# Patient Record
Sex: Female | Born: 1970 | Race: White | Hispanic: No | Smoking: Never smoker
Health system: Southern US, Community
[De-identification: ages and names within clinical notes are randomized; demographics above are authoritative.]

## PROBLEM LIST (undated history)

## (undated) DIAGNOSIS — R319 Hematuria, unspecified: Secondary | ICD-10-CM

## (undated) DIAGNOSIS — E785 Hyperlipidemia, unspecified: Secondary | ICD-10-CM

## (undated) DIAGNOSIS — E119 Type 2 diabetes mellitus without complications: Secondary | ICD-10-CM

## (undated) DIAGNOSIS — I2510 Atherosclerotic heart disease of native coronary artery without angina pectoris: Secondary | ICD-10-CM

## (undated) DIAGNOSIS — I10 Essential (primary) hypertension: Secondary | ICD-10-CM

## (undated) HISTORY — DX: Essential (primary) hypertension: I10

## (undated) HISTORY — PX: OTHER SURGICAL HISTORY: SHX169

## (undated) HISTORY — DX: Hematuria, unspecified: R31.9

## (undated) HISTORY — PX: CHOLECYSTECTOMY: SHX55

## (undated) HISTORY — DX: Hyperlipidemia, unspecified: E78.5

## (undated) HISTORY — DX: Type 2 diabetes mellitus without complications: E11.9

## (undated) HISTORY — DX: Atherosclerotic heart disease of native coronary artery without angina pectoris: I25.10

---

## 2000-10-20 ENCOUNTER — Emergency Department (HOSPITAL_COMMUNITY): Admission: EM | Admit: 2000-10-20 | Discharge: 2000-10-21 | Payer: Self-pay | Admitting: Emergency Medicine

## 2001-01-15 ENCOUNTER — Other Ambulatory Visit: Admission: RE | Admit: 2001-01-15 | Discharge: 2001-01-15 | Payer: Self-pay | Admitting: General Surgery

## 2015-02-21 ENCOUNTER — Encounter: Payer: Self-pay | Admitting: *Deleted

## 2015-02-24 ENCOUNTER — Encounter: Payer: Self-pay | Admitting: *Deleted

## 2015-02-27 ENCOUNTER — Encounter: Payer: Self-pay | Admitting: Cardiovascular Disease

## 2015-02-27 ENCOUNTER — Encounter: Payer: Self-pay | Admitting: *Deleted

## 2015-02-27 ENCOUNTER — Ambulatory Visit (INDEPENDENT_AMBULATORY_CARE_PROVIDER_SITE_OTHER): Payer: BLUE CROSS/BLUE SHIELD | Admitting: Cardiovascular Disease

## 2015-02-27 VITALS — BP 130/82 | HR 86 | Ht 59.0 in | Wt 170.0 lb

## 2015-02-27 DIAGNOSIS — E785 Hyperlipidemia, unspecified: Secondary | ICD-10-CM

## 2015-02-27 DIAGNOSIS — Z8249 Family history of ischemic heart disease and other diseases of the circulatory system: Secondary | ICD-10-CM

## 2015-02-27 DIAGNOSIS — R079 Chest pain, unspecified: Secondary | ICD-10-CM | POA: Diagnosis not present

## 2015-02-27 NOTE — Patient Instructions (Signed)
Your physician has requested that you have an exercise tolerance test. For further information please visit [email or web address] Please also follow instruction sheet, as given. Office will contact with results via phone or letter.   Continue all current medications. Follow up in  6 weeks

## 2015-02-27 NOTE — Progress Notes (Signed)
Patient ID: Tracie Cochran, female   DOB: 15-Apr-1970, 45 y.o.   MRN: [id number]       CARDIOLOGY CONSULT NOTE  Patient ID: Tracie Cochran MRN: [id number] DOB/AGE: 03-Jul-1970 45 y.o.  Admit date: (Not on file) Primary Physician Lenise Herald, PA-C  Reason for Consultation: chest pain  HPI: The patient is a 45 year old woman with a history of GERD and hyperlipidemia who is referred for the evaluation of chest pain. ECG performed at outside facility on 01/17/15 did not demonstrate any ischemic ST segment or T-wave abnormalities.  She had been experiencing chest pressure which awoke her from sleep but after she was started on omeprazole, her symptoms subsided. She has been having chest pain at rest on the left lateral side of her breast as well as underneath her left breast. She does not have any significant exertional chest pain or dyspnea. She has had some left-sided neck pain.   Review of labs performed on 01/18/15 showed hemoglobin 12.6, platelets 379, glucose 87, BUN 13, creatinine 0.58, total cholesterol 223, triglycerides 136, HDL 75, LDL 121.  Fam: Mother had diabetes, CAD in her 61's, died at 36. Father died at age 64, had CAD.   No Known Allergies  Current Outpatient Prescriptions  Medication Sig Dispense Refill  . omeprazole (PRILOSEC) 40 MG capsule Take 1 capsule by mouth daily.  0  . pravastatin (PRAVACHOL) 20 MG tablet Take 1 tablet by mouth daily.  1   No current facility-administered medications for this visit.    Past Medical History  Diagnosis Date  . Coronary artery disease   . Hypertension   . Diabetes mellitus without complication (HCC)   . Hyperlipidemia     No past surgical history on file.  Social History   Social History  . Marital Status: Legally Separated    Spouse Name: N/A  . Number of Children: N/A  . Years of Education: N/A   Occupational History  . Not on file.   Social History Main Topics  . Smoking status: Never Smoker   .  Smokeless tobacco: Not on file  . Alcohol Use: Not on file  . Drug Use: Not on file  . Sexual Activity: Not on file   Other Topics Concern  . Not on file   Social History Narrative  . No narrative on file       Prior to Admission medications   Medication Sig Start Date End Date Taking? Authorizing Provider  omeprazole (PRILOSEC) 40 MG capsule Take 1 capsule by mouth daily. 02/21/15  Yes Historical Provider, MD  pravastatin (PRAVACHOL) 20 MG tablet Take 1 tablet by mouth daily. 02/21/15  Yes Historical Provider, MD     Review of systems complete and found to be negative unless listed above in HPI     Physical exam Blood pressure 130/82, pulse 86, height  (1.499 m), weight 170 lb (77.111 kg). General: NAD Neck: No JVD, no thyromegaly or thyroid nodule.  Lungs: Clear to auscultation bilaterally with normal respiratory effort. CV: Nondisplaced PMI. Regular rate and rhythm, normal S1/S2, no S3/S4, no murmur.  No peripheral edema.  No carotid bruit.  Normal pedal pulses.  Abdomen: Soft, nontender, no hepatosplenomegaly, no distention.  Skin: Intact without lesions or rashes.  Neurologic: Alert and oriented x 3.  Psych: Normal affect. Extremities: No clubbing or cyanosis.  HEENT: Normal.   ECG: Most recent ECG reviewed.  Labs:  No results found for: WBC, HGB, HCT, MCV, PLT No results for input(s): NA,  K, CL, CO2, BUN, CREATININE, CALCIUM, PROT, BILITOT, ALKPHOS, ALT, AST, GLUCOSE in the last 168 hours.  Invalid input(s): LABALBU No results found for: CKTOTAL, CKMB, CKMBINDEX, TROPONINI No results found for: CHOL No results found for: HDL No results found for: LDLCALC No results found for: TRIG No results found for: CHOLHDL No results found for: LDLDIRECT       Studies: No results found.  ASSESSMENT AND PLAN:  1. Chest pain: Somewhat atypical features for CAD. Family h/o premature CAD in mother. Had hyperlipidemia which is treated. Will obtain a GXT.  2.  Hyperlipidemia: Mildly elevated TC noted above. Continue pravastatin.  Dispo: f/u 6 weeks.   Signed: Prentice Docker, M.D., F.A.C.C.  02/27/2015, 2:41 PM

## 2015-03-10 ENCOUNTER — Ambulatory Visit (HOSPITAL_COMMUNITY): Admission: RE | Admit: 2015-03-10 | Payer: BLUE CROSS/BLUE SHIELD | Source: Ambulatory Visit

## 2015-04-11 ENCOUNTER — Telehealth: Payer: Self-pay | Admitting: *Deleted

## 2015-04-11 NOTE — Telephone Encounter (Signed)
Last seen 02/27/15 - Dr. Kirtland BouchardK requested patient have GXT.  Was scheduled for 03/10/2015, but patient did not show.  Patient has 6 week follow up scheduled for 04/17/2015.    Left message to return call.

## 2015-04-17 ENCOUNTER — Ambulatory Visit: Payer: BLUE CROSS/BLUE SHIELD | Admitting: Cardiovascular Disease

## 2015-04-21 NOTE — Telephone Encounter (Signed)
Appointment for 04/17/2015 was cancelled by patient.  Reason given - (PATIENT CALLED SAID TO CANCEL APPT. DID NOT WANT TO RESCHEDULE - NO REASON GIVEN )

## 2015-11-28 ENCOUNTER — Encounter (HOSPITAL_COMMUNITY): Payer: Self-pay | Admitting: Emergency Medicine

## 2015-11-28 ENCOUNTER — Emergency Department (HOSPITAL_COMMUNITY)
Admission: EM | Admit: 2015-11-28 | Discharge: 2015-11-28 | Disposition: A | Discharge status: Home / Self Care | Payer: BLUE CROSS/BLUE SHIELD | Attending: Emergency Medicine | Admitting: Emergency Medicine

## 2015-11-28 DIAGNOSIS — Z79899 Other long term (current) drug therapy: Secondary | ICD-10-CM | POA: Insufficient documentation

## 2015-11-28 DIAGNOSIS — E119 Type 2 diabetes mellitus without complications: Secondary | ICD-10-CM | POA: Diagnosis not present

## 2015-11-28 DIAGNOSIS — I2510 Atherosclerotic heart disease of native coronary artery without angina pectoris: Secondary | ICD-10-CM | POA: Diagnosis not present

## 2015-11-28 DIAGNOSIS — I10 Essential (primary) hypertension: Secondary | ICD-10-CM | POA: Insufficient documentation

## 2015-11-28 DIAGNOSIS — D649 Anemia, unspecified: Secondary | ICD-10-CM | POA: Diagnosis not present

## 2015-11-28 LAB — URINALYSIS, ROUTINE W REFLEX MICROSCOPIC
Bilirubin Urine: NEGATIVE
GLUCOSE, UA: NEGATIVE mg/dL
Ketones, ur: NEGATIVE mg/dL
Leukocytes, UA: NEGATIVE
Nitrite: NEGATIVE
Protein, ur: NEGATIVE mg/dL
SPECIFIC GRAVITY, URINE: 1.01 (ref 1.005–1.030)
pH: 5.5 (ref 5.0–8.0)

## 2015-11-28 LAB — BASIC METABOLIC PANEL
Anion gap: 6 (ref 5–15)
BUN: 16 mg/dL (ref 6–20)
CO2: 25 mmol/L (ref 22–32)
Calcium: 9.6 mg/dL (ref 8.9–10.3)
Chloride: 105 mmol/L (ref 101–111)
Creatinine, Ser: 0.66 mg/dL (ref 0.44–1.00)
GFR calc Af Amer: >60 mL/min (ref >60)
GLUCOSE: 117 mg/dL — AB (ref 65–99)
POTASSIUM: 3.2 mmol/L — AB (ref 3.5–5.1)
Sodium: 136 mmol/L (ref 135–145)

## 2015-11-28 LAB — PREGNANCY, URINE: Preg Test, Ur: NEGATIVE

## 2015-11-28 LAB — CBC WITH DIFFERENTIAL/PLATELET
BASOS PCT: 1 %
Basophils Absolute: 0.1 10*3/uL (ref 0.0–0.1)
EOS ABS: 0.7 10*3/uL (ref 0.0–0.7)
Eosinophils Relative: 7 %
HCT: 32.5 % — ABNORMAL LOW (ref 36.0–46.0)
Hemoglobin: 9.5 g/dL — ABNORMAL LOW (ref 12.0–15.0)
LYMPHS ABS: 2.3 10*3/uL (ref 0.7–4.0)
LYMPHS PCT: 24 %
MCH: 19.6 pg — AB (ref 26.0–34.0)
MCHC: 29.2 g/dL — ABNORMAL LOW (ref 30.0–36.0)
MCV: 67.1 fL — AB (ref 78.0–100.0)
MONO ABS: 0.7 10*3/uL (ref 0.1–1.0)
Monocytes Relative: 7 %
NEUTROS ABS: 5.7 10*3/uL (ref 1.7–7.7)
Neutrophils Relative %: 61 %
PLATELETS: 492 10*3/uL — AB (ref 150–400)
RBC: 4.84 MIL/uL (ref 3.87–5.11)
RDW: 17.9 % — ABNORMAL HIGH (ref 11.5–15.5)
WBC: 9.5 10*3/uL (ref 4.0–10.5)

## 2015-11-28 LAB — URINE MICROSCOPIC-ADD ON

## 2015-11-28 MED ORDER — POTASSIUM CHLORIDE CRYS ER 20 MEQ PO TBCR
40.0000 meq | EXTENDED_RELEASE_TABLET | Freq: Once | ORAL | Status: AC
  Administered 2015-11-28: 40 meq via ORAL
  Filled 2015-11-28: qty 2

## 2015-11-28 NOTE — ED Notes (Signed)
Pt states she was sent by occupational health nurse at her job for HGB of 9.0 drawn x 1 week ago. Pt denies any complaints.

## 2015-11-28 NOTE — ED Provider Notes (Signed)
[facility] DEPT Provider Note   CSN: [id number] Arrival date & time: 11/28/15  1341     History   Chief Complaint Chief Complaint  Patient presents with  . Low Hemoglobin    HPI Tracie Cochran is a 45 y.o. female.  HPI  Pt was seen at 1440. Per pt: Pt sent to the ED for evaluation of "abnormal labs" drawn 1 week ago. Pt states the occupational health nurse at her job called her PMD who told her to come to the ED for evaluation for a "Hgb 9." Pt denies any symptoms. Pt endorses "heavier menses" with last menses 11/14/15. Denies black or blood in stools. Denies CP/palpitations, no SOB, no back pain, no abd pain, no pelvic pain, no N/V/D, no fevers, no rash.    Past Medical History:  Diagnosis Date  . Coronary artery disease   . Diabetes mellitus without complication (HCC)   . Hyperlipidemia   . Hypertension     There are no active problems to display for this patient.   Past Surgical History:  Procedure Laterality Date  . cesaerian      OB History    Gravida Para Term Preterm AB Living   1             SAB TAB Ectopic Multiple Live Births                   Home Medications    Prior to Admission medications   Medication Sig Start Date End Date Taking? Authorizing Provider  omeprazole (PRILOSEC) 40 MG capsule Take 1 capsule by mouth daily. 02/21/15   Historical Provider, MD  pravastatin (PRAVACHOL) 20 MG tablet Take 1 tablet by mouth daily. 02/21/15   Historical Provider, MD    Family History Family History  Problem Relation Age of Onset  . CAD      Social History Social History  Substance Use Topics  . Smoking status: Never Smoker  . Smokeless tobacco: Never Used  . Alcohol use No     Allergies   Review of patient's allergies indicates no known allergies.   Review of Systems Review of Systems ROS: Statement: All systems negative except as marked or noted in the HPI; Constitutional: Negative for fever and chills. ; ; Eyes: Negative for eye  pain, redness and discharge. ; ; ENMT: Negative for ear pain, hoarseness, nasal congestion, sinus pressure and sore throat. ; ; Cardiovascular: Negative for chest pain, palpitations, diaphoresis, dyspnea and peripheral edema. ; ; Respiratory: Negative for cough, wheezing and stridor. ; ; Gastrointestinal: Negative for nausea, vomiting, diarrhea, abdominal pain, blood in stool, hematemesis, jaundice and rectal bleeding. . ; ; Genitourinary: Negative for dysuria, flank pain and hematuria. ; ; Musculoskeletal: Negative for back pain and neck pain. Negative for swelling and trauma.; ; Skin: Negative for pruritus, rash, abrasions, blisters, bruising and skin lesion.; ; Neuro: Negative for headache, lightheadedness and neck stiffness. Negative for weakness, altered level of consciousness, altered mental status, extremity weakness, paresthesias, involuntary movement, seizure and syncope.       Physical Exam Updated Vital Signs BP 151/89 (BP Location: Left Arm)   Pulse 84   Temp 97.7 F (36.5 C) (Oral)   Resp 20   Ht 5' (1.524 m)   Wt 164 lb (74.4 kg)   LMP 11/14/2015   SpO2 100%   BMI 32.03 kg/m   14:52:02 Orthostatic Vital Signs TV  Orthostatic Lying   BP- Lying: 121/72  Pulse- Lying: 78  Orthostatic Sitting  BP- Sitting: 124/79  Pulse- Sitting: 82      Orthostatic Standing at 0 minutes  BP- Standing at 0 minutes: 124/87  Pulse- Standing at 0 minutes: 88     Physical Exam 1445: Physical examination:  Nursing notes reviewed; Vital signs and O2 SAT reviewed;  Constitutional: Well developed, Well nourished, Well hydrated, In no acute distress; Head:  Normocephalic, atraumatic; Eyes: EOMI, PERRL, No scleral icterus; ENMT: Mouth and pharynx normal, Mucous membranes moist; Neck: Supple, Full range of motion, No lymphadenopathy; Cardiovascular: Regular rate and rhythm, No gallop; Respiratory: Breath sounds clear & equal bilaterally, No wheezes.  Speaking full sentences with ease, Normal  respiratory effort/excursion; Chest: Nontender, Movement normal; Abdomen: Soft, Nontender, Nondistended, Normal bowel sounds. Rectal exam performed w/permission of pt and ED RN chaperone present.  Anal tone normal.  Non-tender, soft brown stool in rectal vault, heme neg.  No fissures, no external hemorrhoids, no palp masses.; Genitourinary: No CVA tenderness; Extremities: Pulses normal, No tenderness, No edema, No calf edema or asymmetry.; Neuro: AA&Ox3, Major CN grossly intact.  Speech clear. No gross focal motor or sensory deficits in extremities.; Skin: Color normal, Warm, Dry.   ED Treatments / Results  Labs (all labs ordered are listed, but only abnormal results are displayed)   EKG  EKG Interpretation None       Radiology   Procedures Procedures (including critical care time)  Medications Ordered in ED Medications - No data to display   Initial Impression / Assessment and Plan / ED Course  I have reviewed the triage vital signs and the nursing notes.  Pertinent labs & imaging results that were available during my care of the patient were reviewed by me and considered in my medical decision making (see chart for details).  MDM Reviewed: previous chart, nursing note and vitals Reviewed previous: labs Interpretation: labs   Results for orders placed or performed during the hospital encounter of 11/28/15  CBC with Differential  Result Value Ref Range   WBC 9.5 4.0 - 10.5 K/uL   RBC 4.84 3.87 - 5.11 MIL/uL   Hemoglobin 9.5 (L) 12.0 - 15.0 g/dL   HCT 40.9 (L) 81.1 - 91.4 %   MCV 67.1 (L) 78.0 - 100.0 fL   MCH 19.6 (L) 26.0 - 34.0 pg   MCHC 29.2 (L) 30.0 - 36.0 g/dL   RDW 78.2 (H) 95.6 - 21.3 %   Platelets 492 (H) 150 - 400 K/uL   Neutrophils Relative % 61 %   Lymphocytes Relative 24 %   Monocytes Relative 7 %   Eosinophils Relative 7 %   Basophils Relative 1 %   Neutro Abs 5.7 1.7 - 7.7 K/uL   Lymphs Abs 2.3 0.7 - 4.0 K/uL   Monocytes Absolute 0.7 0.1 - 1.0 K/uL    Eosinophils Absolute 0.7 0.0 - 0.7 K/uL   Basophils Absolute 0.1 0.0 - 0.1 K/uL   RBC Morphology ELLIPTOCYTES    Smear Review PLATELET COUNT CONFIRMED BY SMEAR   Basic metabolic panel  Result Value Ref Range   Sodium 136 135 - 145 mmol/L   Potassium 3.2 (L) 3.5 - 5.1 mmol/L   Chloride 105 101 - 111 mmol/L   CO2 25 22 - 32 mmol/L   Glucose, Bld 117 (H) 65 - 99 mg/dL   BUN 16 6 - 20 mg/dL   Creatinine, Ser 0.86 0.44 - 1.00 mg/dL   Calcium 9.6 8.9 - 57.8 mg/dL   GFR calc non Af Amer >60 >  60 mL/min   GFR calc Af Amer >60 >60 mL/min   Anion gap 6 5 - 15  Urinalysis, Routine w reflex microscopic  Result Value Ref Range   Color, Urine YELLOW YELLOW   APPearance CLEAR CLEAR   Specific Gravity, Urine 1.010 1.005 - 1.030   pH 5.5 5.0 - 8.0   Glucose, UA NEGATIVE NEGATIVE mg/dL   Hgb urine dipstick SMALL (A) NEGATIVE   Bilirubin Urine NEGATIVE NEGATIVE   Ketones, ur NEGATIVE NEGATIVE mg/dL   Protein, ur NEGATIVE NEGATIVE mg/dL   Nitrite NEGATIVE NEGATIVE   Leukocytes, UA NEGATIVE NEGATIVE  Pregnancy, urine  Result Value Ref Range   Preg Test, Ur NEGATIVE NEGATIVE  Urine microscopic-add on  Result Value Ref Range   Squamous Epithelial / LPF 0-5 (A) NONE SEEN   WBC, UA 0-5 0 - 5 WBC/hpf   RBC / HPF 0-5 0 - 5 RBC/hpf   Bacteria, UA RARE (A) NONE SEEN    1615:  Pt states her Hgb was "9" two weeks ago. Stable today. VSS, not orthostatic. Stool heme negative. Pt denies any complaints. Pt would like to go home now. No clear indication for admission/transfusion at this time. Will d/c home with outpatient f/u. Dx and testing d/w pt.  Questions answered.  Verb understanding, agreeable to d/c home with outpt f/u.    Final Clinical Impressions(s) / ED Diagnoses   Final diagnoses:  None    New Prescriptions New Prescriptions   No medications on file     Samuel JesterKathleen Tajae Rybicki, DO 11/29/15 2033

## 2015-11-28 NOTE — ED Triage Notes (Signed)
Patient was called by [place] office to come into ED due to Hemoglobin of 9.0. Patient denies black tarry stools. Patient states feeling fatigued.

## 2015-11-28 NOTE — Discharge Instructions (Signed)
Take your usual prescriptions as previously directed.  Call your regular medical doctor and your OB/GYN doctor tomorrow to schedule a follow up appointment within the next week.  Return to the Emergency Department immediately sooner if worsening.

## 2015-11-29 LAB — POC OCCULT BLOOD, ED: Fecal Occult Bld: NEGATIVE

## 2016-05-06 ENCOUNTER — Encounter: Payer: Self-pay | Admitting: *Deleted

## 2017-06-17 DIAGNOSIS — R7301 Impaired fasting glucose: Secondary | ICD-10-CM | POA: Diagnosis not present

## 2017-06-17 DIAGNOSIS — Z719 Counseling, unspecified: Secondary | ICD-10-CM | POA: Diagnosis not present

## 2017-06-17 DIAGNOSIS — E559 Vitamin D deficiency, unspecified: Secondary | ICD-10-CM | POA: Diagnosis not present

## 2017-06-17 DIAGNOSIS — Z008 Encounter for other general examination: Secondary | ICD-10-CM | POA: Diagnosis not present

## 2017-07-08 DIAGNOSIS — E559 Vitamin D deficiency, unspecified: Secondary | ICD-10-CM | POA: Diagnosis not present

## 2017-07-08 DIAGNOSIS — E785 Hyperlipidemia, unspecified: Secondary | ICD-10-CM | POA: Diagnosis not present

## 2017-07-08 DIAGNOSIS — D649 Anemia, unspecified: Secondary | ICD-10-CM | POA: Diagnosis not present

## 2017-07-08 DIAGNOSIS — Z79899 Other long term (current) drug therapy: Secondary | ICD-10-CM | POA: Diagnosis not present

## 2017-07-24 DIAGNOSIS — R7301 Impaired fasting glucose: Secondary | ICD-10-CM | POA: Diagnosis not present

## 2017-07-24 DIAGNOSIS — E785 Hyperlipidemia, unspecified: Secondary | ICD-10-CM | POA: Diagnosis not present

## 2017-07-24 DIAGNOSIS — D649 Anemia, unspecified: Secondary | ICD-10-CM | POA: Diagnosis not present

## 2017-07-24 DIAGNOSIS — E559 Vitamin D deficiency, unspecified: Secondary | ICD-10-CM | POA: Diagnosis not present

## 2017-11-25 DIAGNOSIS — Z719 Counseling, unspecified: Secondary | ICD-10-CM | POA: Diagnosis not present

## 2017-11-25 DIAGNOSIS — E559 Vitamin D deficiency, unspecified: Secondary | ICD-10-CM | POA: Diagnosis not present

## 2017-11-25 DIAGNOSIS — Z008 Encounter for other general examination: Secondary | ICD-10-CM | POA: Diagnosis not present

## 2017-11-25 DIAGNOSIS — R7301 Impaired fasting glucose: Secondary | ICD-10-CM | POA: Diagnosis not present

## 2018-01-23 DIAGNOSIS — S82891D Other fracture of right lower leg, subsequent encounter for closed fracture with routine healing: Secondary | ICD-10-CM | POA: Diagnosis not present

## 2018-01-23 DIAGNOSIS — S8291XA Unspecified fracture of right lower leg, initial encounter for closed fracture: Secondary | ICD-10-CM | POA: Diagnosis not present

## 2018-02-06 DIAGNOSIS — S82891D Other fracture of right lower leg, subsequent encounter for closed fracture with routine healing: Secondary | ICD-10-CM | POA: Diagnosis not present

## 2018-02-10 DIAGNOSIS — Z0000 Encounter for general adult medical examination without abnormal findings: Secondary | ICD-10-CM | POA: Diagnosis not present

## 2018-02-10 DIAGNOSIS — Z79899 Other long term (current) drug therapy: Secondary | ICD-10-CM | POA: Diagnosis not present

## 2018-02-10 DIAGNOSIS — R7301 Impaired fasting glucose: Secondary | ICD-10-CM | POA: Diagnosis not present

## 2018-02-10 DIAGNOSIS — E559 Vitamin D deficiency, unspecified: Secondary | ICD-10-CM | POA: Diagnosis not present

## 2018-02-10 DIAGNOSIS — E785 Hyperlipidemia, unspecified: Secondary | ICD-10-CM | POA: Diagnosis not present

## 2018-02-10 DIAGNOSIS — D649 Anemia, unspecified: Secondary | ICD-10-CM | POA: Diagnosis not present

## 2018-02-24 DIAGNOSIS — R7301 Impaired fasting glucose: Secondary | ICD-10-CM | POA: Diagnosis not present

## 2018-02-24 DIAGNOSIS — E559 Vitamin D deficiency, unspecified: Secondary | ICD-10-CM | POA: Diagnosis not present

## 2018-02-24 DIAGNOSIS — D649 Anemia, unspecified: Secondary | ICD-10-CM | POA: Diagnosis not present

## 2018-02-24 DIAGNOSIS — E785 Hyperlipidemia, unspecified: Secondary | ICD-10-CM | POA: Diagnosis not present

## 2018-08-18 DIAGNOSIS — R7301 Impaired fasting glucose: Secondary | ICD-10-CM | POA: Diagnosis not present

## 2018-08-18 DIAGNOSIS — E785 Hyperlipidemia, unspecified: Secondary | ICD-10-CM | POA: Diagnosis not present

## 2018-08-18 DIAGNOSIS — Z79899 Other long term (current) drug therapy: Secondary | ICD-10-CM | POA: Diagnosis not present

## 2018-08-18 DIAGNOSIS — E559 Vitamin D deficiency, unspecified: Secondary | ICD-10-CM | POA: Diagnosis not present

## 2018-08-18 DIAGNOSIS — Z0130 Encounter for examination of blood pressure without abnormal findings: Secondary | ICD-10-CM | POA: Diagnosis not present

## 2018-10-12 DIAGNOSIS — Z1389 Encounter for screening for other disorder: Secondary | ICD-10-CM | POA: Diagnosis not present

## 2018-10-12 DIAGNOSIS — M5417 Radiculopathy, lumbosacral region: Secondary | ICD-10-CM | POA: Diagnosis not present

## 2018-10-12 DIAGNOSIS — Z6835 Body mass index (BMI) 35.0-35.9, adult: Secondary | ICD-10-CM | POA: Diagnosis not present

## 2018-10-12 DIAGNOSIS — R3129 Other microscopic hematuria: Secondary | ICD-10-CM | POA: Diagnosis not present

## 2018-10-12 DIAGNOSIS — M5410 Radiculopathy, site unspecified: Secondary | ICD-10-CM | POA: Diagnosis not present

## 2018-10-12 DIAGNOSIS — K219 Gastro-esophageal reflux disease without esophagitis: Secondary | ICD-10-CM | POA: Diagnosis not present

## 2018-11-03 DIAGNOSIS — N390 Urinary tract infection, site not specified: Secondary | ICD-10-CM | POA: Diagnosis not present

## 2018-11-06 DIAGNOSIS — Z124 Encounter for screening for malignant neoplasm of cervix: Secondary | ICD-10-CM | POA: Diagnosis not present

## 2018-11-06 DIAGNOSIS — R238 Other skin changes: Secondary | ICD-10-CM | POA: Diagnosis not present

## 2018-11-06 DIAGNOSIS — E6609 Other obesity due to excess calories: Secondary | ICD-10-CM | POA: Diagnosis not present

## 2018-11-06 DIAGNOSIS — Z01411 Encounter for gynecological examination (general) (routine) with abnormal findings: Secondary | ICD-10-CM | POA: Diagnosis not present

## 2018-11-06 DIAGNOSIS — Z6834 Body mass index (BMI) 34.0-34.9, adult: Secondary | ICD-10-CM | POA: Diagnosis not present

## 2018-11-10 DIAGNOSIS — E669 Obesity, unspecified: Secondary | ICD-10-CM | POA: Diagnosis not present

## 2018-11-10 DIAGNOSIS — Z713 Dietary counseling and surveillance: Secondary | ICD-10-CM | POA: Diagnosis not present

## 2018-11-10 DIAGNOSIS — Z6835 Body mass index (BMI) 35.0-35.9, adult: Secondary | ICD-10-CM | POA: Diagnosis not present

## 2018-11-11 DIAGNOSIS — E6609 Other obesity due to excess calories: Secondary | ICD-10-CM | POA: Diagnosis not present

## 2018-11-11 DIAGNOSIS — Z6834 Body mass index (BMI) 34.0-34.9, adult: Secondary | ICD-10-CM | POA: Diagnosis not present

## 2018-12-11 ENCOUNTER — Other Ambulatory Visit: Payer: Self-pay

## 2018-12-11 ENCOUNTER — Other Ambulatory Visit (HOSPITAL_COMMUNITY)
Admission: RE | Admit: 2018-12-11 | Discharge: 2018-12-11 | Disposition: A | Discharge status: Home / Self Care | Payer: BC Managed Care – PPO | Source: Ambulatory Visit | Attending: Urology | Admitting: Urology

## 2018-12-11 ENCOUNTER — Ambulatory Visit (INDEPENDENT_AMBULATORY_CARE_PROVIDER_SITE_OTHER): Payer: BC Managed Care – PPO | Admitting: Urology

## 2018-12-11 DIAGNOSIS — R3121 Asymptomatic microscopic hematuria: Secondary | ICD-10-CM

## 2018-12-11 LAB — URINALYSIS, COMPLETE (UACMP) WITH MICROSCOPIC
Bilirubin Urine: NEGATIVE
Glucose, UA: NEGATIVE mg/dL
Ketones, ur: NEGATIVE mg/dL
Leukocytes,Ua: NEGATIVE
Nitrite: NEGATIVE
Protein, ur: NEGATIVE mg/dL
Specific Gravity, Urine: 1.024 (ref 1.005–1.030)
pH: 5 (ref 5.0–8.0)

## 2018-12-15 ENCOUNTER — Other Ambulatory Visit: Payer: Self-pay | Admitting: Urology

## 2018-12-15 ENCOUNTER — Other Ambulatory Visit (HOSPITAL_COMMUNITY): Payer: Self-pay | Admitting: Urology

## 2018-12-15 DIAGNOSIS — R3121 Asymptomatic microscopic hematuria: Secondary | ICD-10-CM

## 2018-12-28 ENCOUNTER — Other Ambulatory Visit: Payer: Self-pay

## 2018-12-28 ENCOUNTER — Ambulatory Visit (HOSPITAL_COMMUNITY)
Admission: RE | Admit: 2018-12-28 | Discharge: 2018-12-28 | Disposition: A | Discharge status: Home / Self Care | Payer: BC Managed Care – PPO | Source: Ambulatory Visit | Attending: Urology | Admitting: Urology

## 2018-12-28 ENCOUNTER — Encounter (HOSPITAL_COMMUNITY): Payer: Self-pay

## 2018-12-28 DIAGNOSIS — R3121 Asymptomatic microscopic hematuria: Secondary | ICD-10-CM | POA: Diagnosis not present

## 2018-12-28 DIAGNOSIS — R3129 Other microscopic hematuria: Secondary | ICD-10-CM | POA: Diagnosis not present

## 2018-12-28 MED ORDER — IOHEXOL 300 MG/ML  SOLN
150.0000 mL | Freq: Once | INTRAMUSCULAR | Status: AC | PRN
  Administered 2018-12-28: 125 mL via INTRAVENOUS

## 2019-02-04 ENCOUNTER — Other Ambulatory Visit: Payer: Self-pay

## 2019-02-04 ENCOUNTER — Ambulatory Visit: Payer: BC Managed Care – PPO | Attending: Internal Medicine

## 2019-02-04 DIAGNOSIS — Z20822 Contact with and (suspected) exposure to covid-19: Secondary | ICD-10-CM | POA: Insufficient documentation

## 2019-02-05 ENCOUNTER — Other Ambulatory Visit: Payer: BC Managed Care – PPO | Admitting: Urology

## 2019-02-06 LAB — NOVEL CORONAVIRUS, NAA: SARS-CoV-2, NAA: NOT DETECTED

## 2019-03-18 NOTE — Progress Notes (Deleted)
Subjective:  No diagnosis found.   Tracie Cochran is a 49 yo female who is sent in consultation by Terie Purser PA for microhematuria with 1+ blood on a recent dip UA. She had a round of antibiotics but the hematuria persisted. She has had no gross hematuria. Her UA has 3+ heme today. She has some nocturia x 1 and she can have some frequency. She has some intermittency. She has no urgency but she can have some SUI. She has had mid back pain but no flank pain. She has no history of stones, UTI's or GU surgery. She has no family history of bladder cancer.   CT IMPRESSION: 1.  No acute process or explanation for hematuria. 2.  Aortic Atherosclerosis (ICD10-I70.0).    ROS:  ROS:  A complete review of systems was performed.  All systems are negative except for pertinent findings as noted.   ROS  No Known Allergies  Outpatient Encounter Medications as of 03/19/2019  Medication Sig Note  . ibuprofen (ADVIL,MOTRIN) 200 MG tablet Take 800 mg by mouth every 6 (six) hours as needed for moderate pain.   Marland Kitchen omeprazole (PRILOSEC) 40 MG capsule Take 1 capsule by mouth daily.   . rosuvastatin (CRESTOR) 5 MG tablet Take 5 mg by mouth daily. 11/28/2015: New prescription. Has not started yet.   No facility-administered encounter medications on file as of 03/19/2019.    Past Medical History:  Diagnosis Date  . Coronary artery disease   . Diabetes mellitus without complication (HCC)   . Hyperlipidemia   . Hypertension     Past Surgical History:  Procedure Laterality Date  . cesaerian      Social History   Socioeconomic History  . Marital status: Divorced    Spouse name: Not on file  . Number of children: Not on file  . Years of education: Not on file  . Highest education level: Not on file  Occupational History  . Not on file  Tobacco Use  . Smoking status: Never Smoker  . Smokeless tobacco: Never Used  Substance and Sexual Activity  . Alcohol use: No    Alcohol/week: 0.0 standard  drinks  . Drug use: No  . Sexual activity: Not on file  Other Topics Concern  . Not on file  Social History Narrative  . Not on file   Social Determinants of Health   Financial Resource Strain:   . Difficulty of Paying Living Expenses: Not on file  Food Insecurity:   . Worried About Programme researcher, broadcasting/film/video in the Last Year: Not on file  . Ran Out of Food in the Last Year: Not on file  Transportation Needs:   . Lack of Transportation (Medical): Not on file  . Lack of Transportation (Non-Medical): Not on file  Physical Activity:   . Days of Exercise per Week: Not on file  . Minutes of Exercise per Session: Not on file  Stress:   . Feeling of Stress : Not on file  Social Connections:   . Frequency of Communication with Friends and Family: Not on file  . Frequency of Social Gatherings with Friends and Family: Not on file  . Attends Religious Services: Not on file  . Active Member of Clubs or Organizations: Not on file  . Attends Banker Meetings: Not on file  . Marital Status: Not on file  Intimate Partner Violence:   . Fear of Current or Ex-Partner: Not on file  . Emotionally Abused: Not on file  .  Physically Abused: Not on file  . Sexually Abused: Not on file    Family History  Problem Relation Age of Onset  . CAD Other        Objective: There were no vitals filed for this visit.   Physical Exam  Lab Results:  No results found for this or any previous visit (from the past 24 hour(s)).  BMET No results for input(s): NA, K, CL, CO2, GLUCOSE, BUN, CREATININE, CALCIUM in the last 72 hours. PSA No results found for: PSA No results found for: TESTOSTERONE    Studies/Results: No results found.    Assessment & Plan: No problem-specific Assessment & Plan notes found for this encounter.    No orders of the defined types were placed in this encounter.    No orders of the defined types were placed in this encounter.     No follow-ups on  file.   CC: Pllc, [place] 03/18/2019

## 2019-03-19 ENCOUNTER — Other Ambulatory Visit: Payer: Self-pay | Admitting: Urology

## 2019-04-07 ENCOUNTER — Ambulatory Visit: Payer: Self-pay | Admitting: Orthopaedic Surgery

## 2019-05-21 ENCOUNTER — Encounter: Payer: Self-pay | Admitting: Urology

## 2019-05-21 ENCOUNTER — Ambulatory Visit (INDEPENDENT_AMBULATORY_CARE_PROVIDER_SITE_OTHER): Payer: BC Managed Care – PPO | Admitting: Urology

## 2019-05-21 ENCOUNTER — Other Ambulatory Visit: Payer: Self-pay

## 2019-05-21 VITALS — BP 139/86 | HR 104 | Temp 98.1°F

## 2019-05-21 DIAGNOSIS — R3129 Other microscopic hematuria: Secondary | ICD-10-CM

## 2019-05-21 LAB — POCT URINALYSIS DIPSTICK
Bilirubin, UA: NEGATIVE
Glucose, UA: NEGATIVE
Ketones, UA: NEGATIVE
Leukocytes, UA: NEGATIVE
Nitrite, UA: NEGATIVE
Protein, UA: NEGATIVE
Spec Grav, UA: >=1.03 — AB (ref 1.010–1.025)
Urobilinogen, UA: NEGATIVE E.U./dL — AB
pH, UA: 5 (ref 5.0–8.0)

## 2019-05-21 NOTE — Progress Notes (Signed)
Subjective:  1. Microhematuria      Tracie Cochran is a 49 yo female who I saw  in consultation by Delman Cheadle PA for microhematuria with 1+ blood  on 12/28/18.  Her UA in the office had 6-10 RBC's and a CT showed no GU findings.  She was to return for cystoscopy and is schedulded for that today.   She has some nocturia x 1 and she can have some frequency. She has some intermittency. She has no urgency but she can have some SUI. She has had mid back pain but no flank pain. She has no history of stones, UTI's or GU surgery. She has no family history of bladder cancer.   UA today has 3+ blood.       ROS:  ROS:  A complete review of systems was performed.  All systems are negative except for pertinent findings as noted.   ROS  No Known Allergies  Outpatient Encounter Medications as of 05/21/2019  Medication Sig Note  . ibuprofen (ADVIL,MOTRIN) 200 MG tablet Take 800 mg by mouth every 6 (six) hours as needed for moderate pain.   Marland Kitchen omeprazole (PRILOSEC) 40 MG capsule Take 1 capsule by mouth daily.   . rosuvastatin (CRESTOR) 5 MG tablet Take 5 mg by mouth daily. 11/28/2015: New prescription. Has not started yet.   No facility-administered encounter medications on file as of 05/21/2019.    Past Medical History:  Diagnosis Date  . Coronary artery disease   . Diabetes mellitus without complication ([place])   . Hematuria   . Hyperlipidemia     Past Surgical History:  Procedure Laterality Date  . cesaerian    . CHOLECYSTECTOMY      Social History   Socioeconomic History  . Marital status: Divorced    Spouse name: Not on file  . Number of children: Not on file  . Years of education: Not on file  . Highest education level: Not on file  Occupational History  . Not on file  Tobacco Use  . Smoking status: Never Smoker  . Smokeless tobacco: Never Used  Substance and Sexual Activity  . Alcohol use: No    Alcohol/week: 0.0 standard drinks  . Drug use: No  . Sexual activity: Not on  file  Other Topics Concern  . Not on file  Social History Narrative  . Not on file   Social Determinants of Health   Financial Resource Strain:   . Difficulty of Paying Living Expenses:   Food Insecurity:   . Worried About Charity fundraiser in the Last Year:   . Arboriculturist in the Last Year:   Transportation Needs:   . Film/video editor (Medical):   Marland Kitchen Lack of Transportation (Non-Medical):   Physical Activity:   . Days of Exercise per Week:   . Minutes of Exercise per Session:   Stress:   . Feeling of Stress :   Social Connections:   . Frequency of Communication with Friends and Family:   . Frequency of Social Gatherings with Friends and Family:   . Attends Religious Services:   . Active Member of Clubs or Organizations:   . Attends Archivist Meetings:   Marland Kitchen Marital Status:   Intimate Partner Violence:   . Fear of Current or Ex-Partner:   . Emotionally Abused:   Marland Kitchen Physically Abused:   . Sexually Abused:     Family History  Problem Relation Age of Onset  . CAD Other   .  Diabetes Father   . Diabetes Mother        Objective: Vitals:   05/21/19 1517  BP: 139/86  Pulse: (!) 104  Temp: 98.1 F (36.7 C)     Physical Exam     Studies/Results: CLINICAL DATA:  Asymptomatic microscopic hematuria for 2 months. Cholecystectomy.  EXAM: CT ABDOMEN AND PELVIS WITHOUT AND WITH CONTRAST  TECHNIQUE: Multidetector CT imaging of the abdomen and pelvis was performed following the standard protocol before and following the bolus administration of intravenous contrast.  CONTRAST:  OMNIPAQUE IOHEXOL 300 MG/ML  SOLN  COMPARISON:  None.  FINDINGS: Lower chest: Clear lung bases. Normal heart size without pericardial or pleural effusion.  Hepatobiliary: Normal liver. Cholecystectomy, without biliary ductal dilatation.  Pancreas: Normal, without mass or ductal dilatation.  Spleen: Capsular based splenic lesion of 2.0 cm is most  consistent with the nonenhancing minimally complex cyst.  Adrenals/Urinary Tract: Normal adrenal glands. No renal calculi or hydronephrosis. No hydroureter or ureteric calculi. No bladder calculi.  Interpolar left renal too small to characterize lesion. No suspicious renal mass. Good renal collecting system opacification on delayed images. Good ureteric opacification, without filling defect  No enhancing bladder mass or filling defect on delayed images.  Stomach/Bowel: Normal stomach, without wall thickening. Normal colon and terminal ileum. Normal small bowel.  Vascular/Lymphatic: Normal caliber of the aorta and branch vessels. No abdominopelvic adenopathy.  Reproductive: Retroverted uterus. Residual left ovarian follicle or cyst at 1.9 cm.  Other: No significant free fluid.  Musculoskeletal: No acute osseous abnormality.  IMPRESSION: 1.  No acute process or explanation for hematuria. 2.  Aortic Atherosclerosis (ICD10-I70.0).   Electronically Signed   By: Jeronimo Greaves M.D.   On: 12/28/2018 13:12  Procedure: Cystoscopy.  She was prepped with betadine.  The flexible scope was inserted without difficulty.  The urethra was normal.  The bladder wall had mild trabeculation with no mucosal lesions.  The UO's were unremarkable.   There were no complications.     Assessment & Plan: Microhematuria.   Her CT and cystoscopy are negative.   She will return in 6 months.   No orders of the defined types were placed in this encounter.    Orders Placed This Encounter  Procedures  . POCT urinalysis dipstick      Return in about 6 months (around 11/20/2019) for with UA.   CC: Pllc, [facility]      Bjorn Pippin 05/21/2019

## 2019-06-23 DIAGNOSIS — L7211 Pilar cyst: Secondary | ICD-10-CM | POA: Diagnosis not present

## 2019-11-12 ENCOUNTER — Ambulatory Visit: Payer: BC Managed Care – PPO | Admitting: Urology

## 2019-11-19 ENCOUNTER — Ambulatory Visit: Payer: BC Managed Care – PPO | Admitting: Urology

## 2020-01-01 DIAGNOSIS — M1712 Unilateral primary osteoarthritis, left knee: Secondary | ICD-10-CM | POA: Diagnosis not present

## 2020-02-07 DIAGNOSIS — M79672 Pain in left foot: Secondary | ICD-10-CM | POA: Diagnosis not present

## 2020-02-07 DIAGNOSIS — M722 Plantar fascial fibromatosis: Secondary | ICD-10-CM | POA: Diagnosis not present

## 2020-02-07 DIAGNOSIS — M7662 Achilles tendinitis, left leg: Secondary | ICD-10-CM | POA: Diagnosis not present

## 2020-02-17 ENCOUNTER — Other Ambulatory Visit: Payer: BC Managed Care – PPO

## 2020-02-17 ENCOUNTER — Other Ambulatory Visit: Payer: Self-pay

## 2020-02-17 DIAGNOSIS — Z20822 Contact with and (suspected) exposure to covid-19: Secondary | ICD-10-CM | POA: Diagnosis not present

## 2020-02-19 LAB — NOVEL CORONAVIRUS, NAA: SARS-CoV-2, NAA: DETECTED — AB

## 2020-02-19 LAB — SARS-COV-2, NAA 2 DAY TAT

## 2020-02-24 DIAGNOSIS — U071 COVID-19: Secondary | ICD-10-CM | POA: Diagnosis not present

## 2020-03-17 DIAGNOSIS — R42 Dizziness and giddiness: Secondary | ICD-10-CM | POA: Diagnosis not present

## 2020-03-17 DIAGNOSIS — Z1331 Encounter for screening for depression: Secondary | ICD-10-CM | POA: Diagnosis not present

## 2020-03-17 DIAGNOSIS — E6609 Other obesity due to excess calories: Secondary | ICD-10-CM | POA: Diagnosis not present

## 2020-03-17 DIAGNOSIS — D509 Iron deficiency anemia, unspecified: Secondary | ICD-10-CM | POA: Diagnosis not present

## 2020-03-17 DIAGNOSIS — Z6835 Body mass index (BMI) 35.0-35.9, adult: Secondary | ICD-10-CM | POA: Diagnosis not present

## 2020-03-20 DIAGNOSIS — Z6835 Body mass index (BMI) 35.0-35.9, adult: Secondary | ICD-10-CM | POA: Diagnosis not present

## 2020-03-20 DIAGNOSIS — D509 Iron deficiency anemia, unspecified: Secondary | ICD-10-CM | POA: Diagnosis not present

## 2020-03-20 DIAGNOSIS — E539 Vitamin B deficiency, unspecified: Secondary | ICD-10-CM | POA: Diagnosis not present

## 2020-03-20 DIAGNOSIS — R42 Dizziness and giddiness: Secondary | ICD-10-CM | POA: Diagnosis not present

## 2020-05-22 DIAGNOSIS — M1712 Unilateral primary osteoarthritis, left knee: Secondary | ICD-10-CM | POA: Diagnosis not present

## 2020-07-17 DIAGNOSIS — M7662 Achilles tendinitis, left leg: Secondary | ICD-10-CM | POA: Diagnosis not present

## 2020-07-17 DIAGNOSIS — M722 Plantar fascial fibromatosis: Secondary | ICD-10-CM | POA: Diagnosis not present

## 2020-07-18 DIAGNOSIS — L918 Other hypertrophic disorders of the skin: Secondary | ICD-10-CM | POA: Diagnosis not present

## 2020-07-18 DIAGNOSIS — L820 Inflamed seborrheic keratosis: Secondary | ICD-10-CM | POA: Diagnosis not present

## 2020-08-11 IMAGING — CT CT ABD-PEL WO/W CM
3 of 12 series · 12 of 46 positions shown, 18 images · IV contrast (omnipaque)
Comparison: None.

CLINICAL DATA: Asymptomatic microscopic hematuria for 2 months.
Cholecystectomy.

EXAM:
CT ABDOMEN AND PELVIS WITHOUT AND WITH CONTRAST
TECHNIQUE: Multidetector CT imaging of the abdomen and pelvis was performed
following the standard protocol before and following the bolus
administration of intravenous contrast.
CONTRAST:  125mL OMNIPAQUE IOHEXOL 300 MG/ML  SOLN

[Series 2: axial pre · axial · non-contrast · 0.67mm/px · z∈[+981,+1266]mm · 5 of 87 slices shown, 10 images]
[im 15/87  soft-tissue]
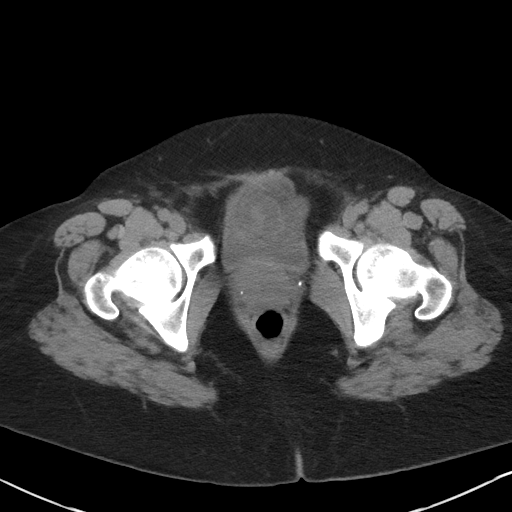
[im 15/87  bone]
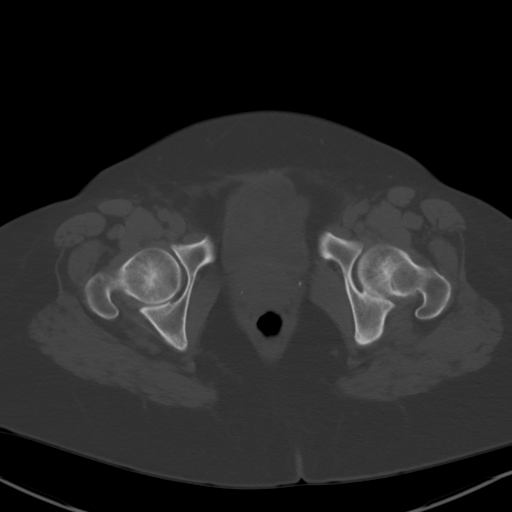
[im 29/87  soft-tissue]
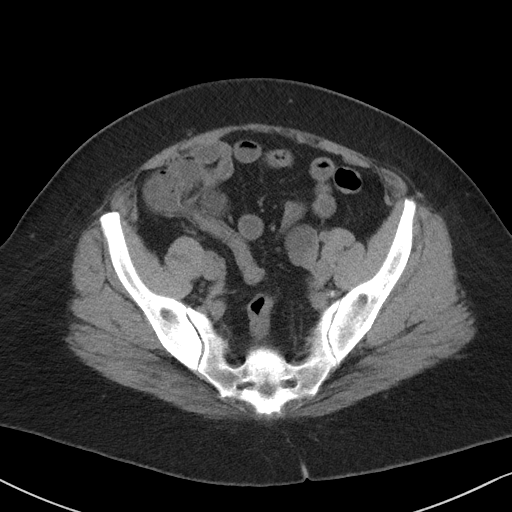
[im 29/87  lung]
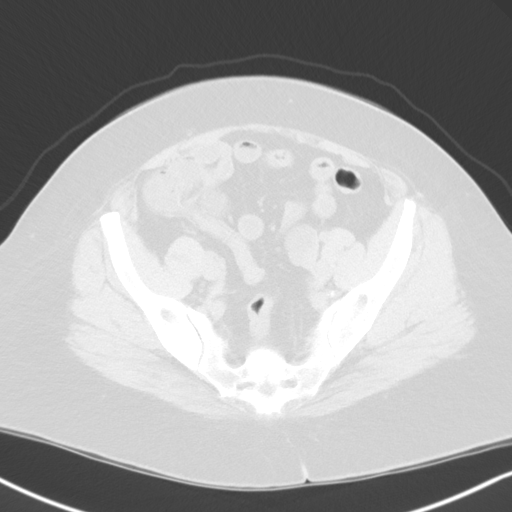
[im 44/87  soft-tissue]
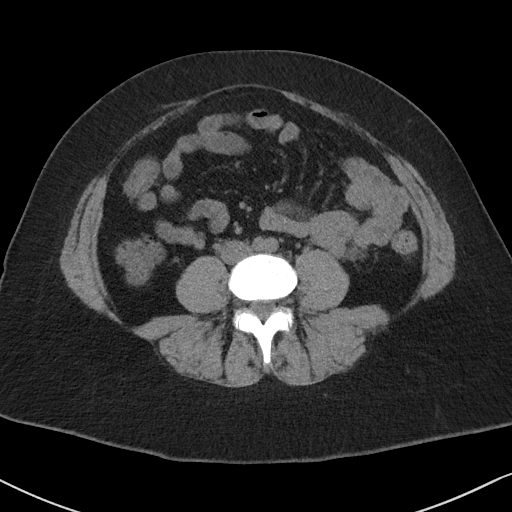
[im 44/87  lung]
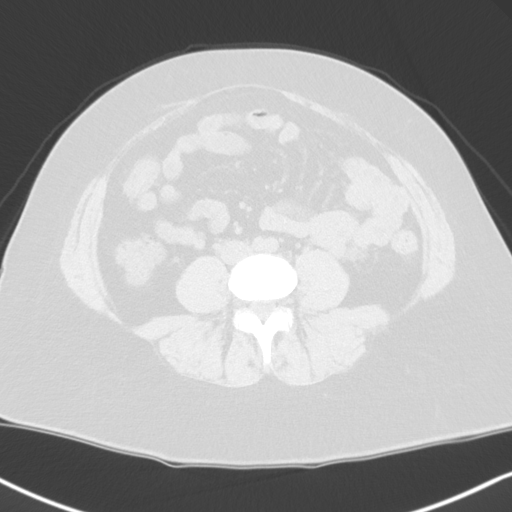
[im 58/87  soft-tissue]
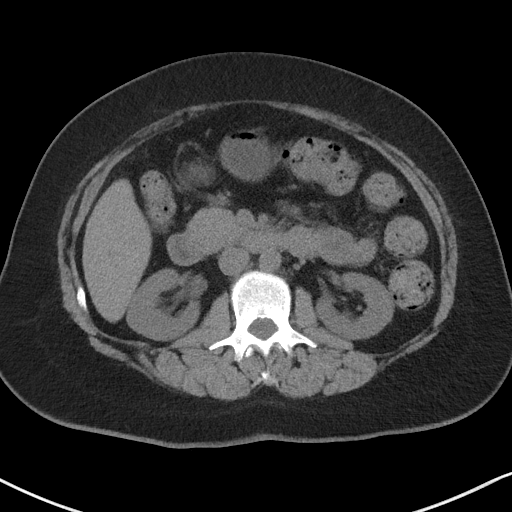
[im 58/87  lung]
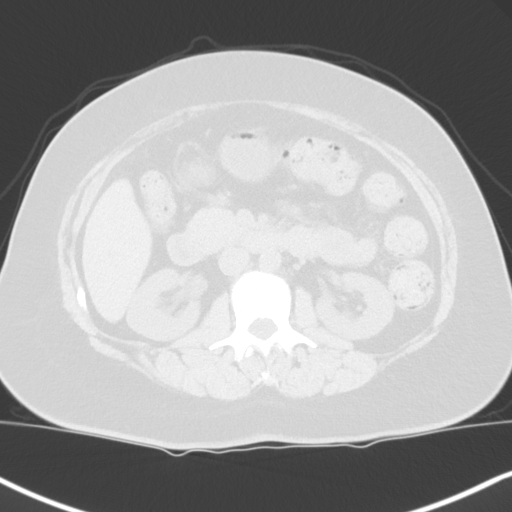
[im 72/87  soft-tissue]
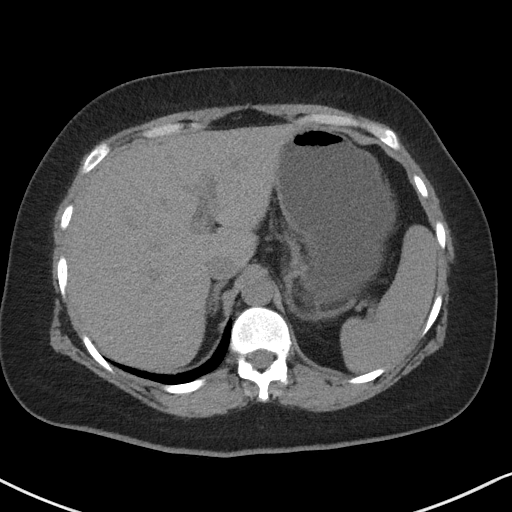
[im 72/87  lung]
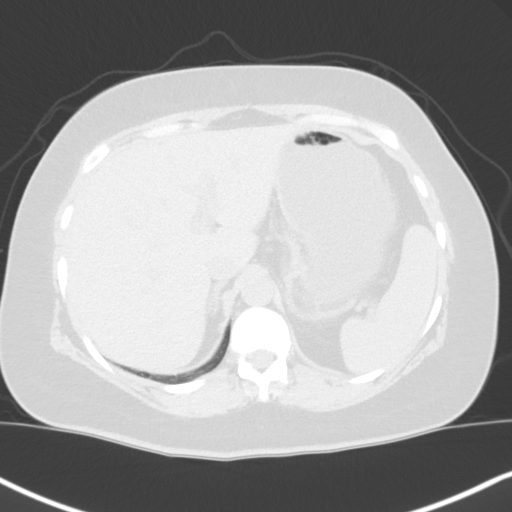

[Series 5: coronal pre · coronal · non-contrast · 0.91mm/px · 2 of 100 slices shown, 3 images]
[im 34/100  soft-tissue]
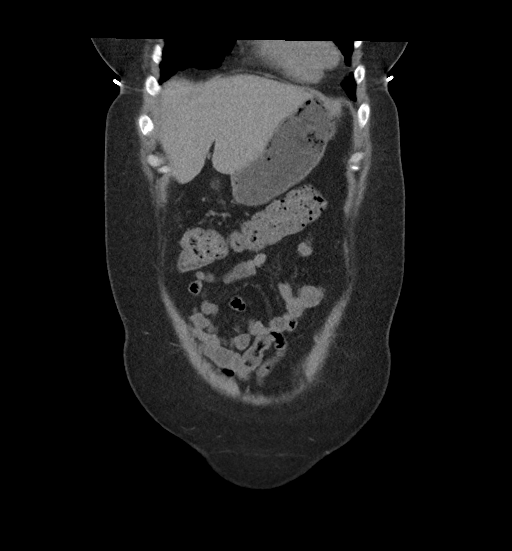
[im 34/100  bone]
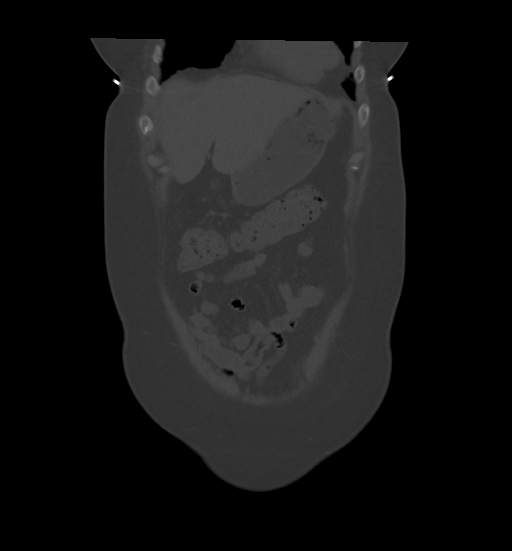
[im 67/100  soft-tissue]
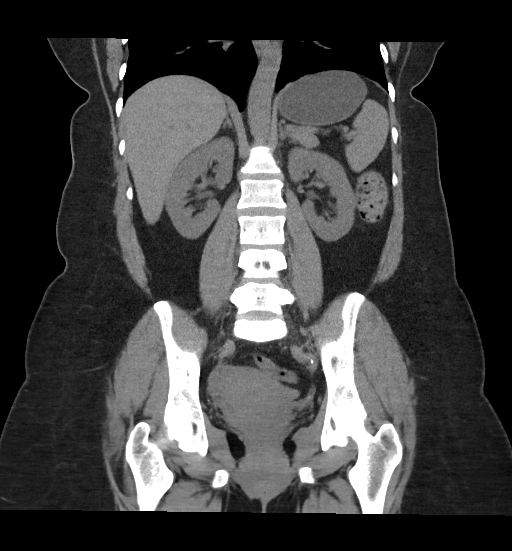

[Series 7: axial post · axial · 0.67mm/px · z∈[+981,+1266]mm · 5 of 87 slices shown]
[im 15/87  soft-tissue]
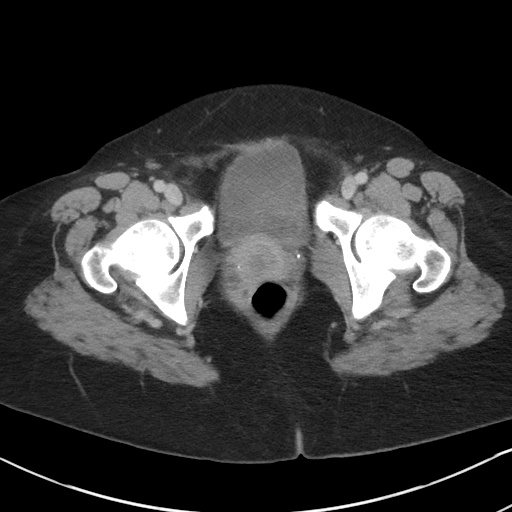
[im 29/87  soft-tissue]
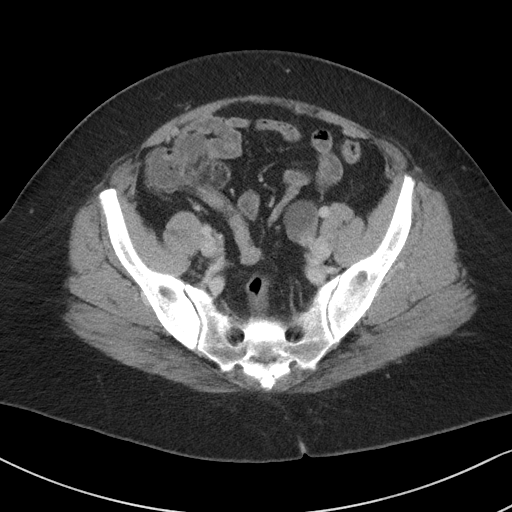
[im 44/87  soft-tissue]
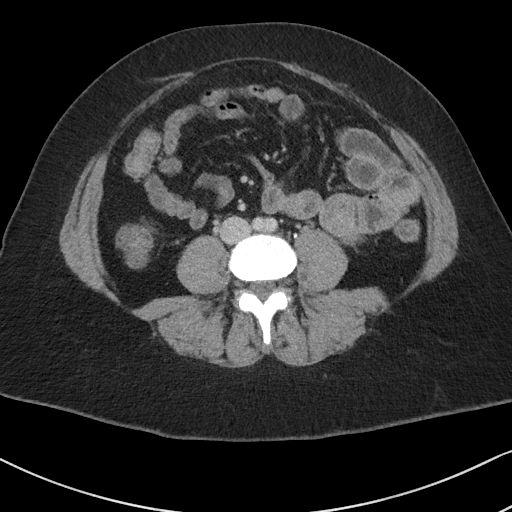
[im 58/87  soft-tissue]
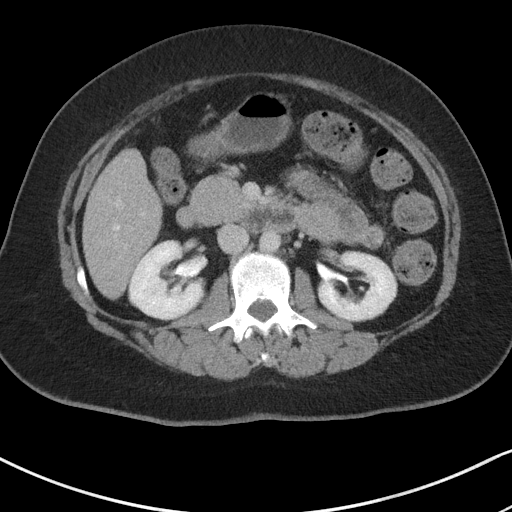
[im 72/87  soft-tissue]
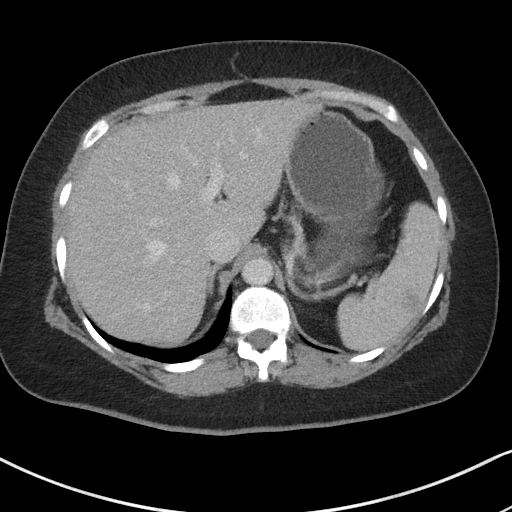

[12 of 46 positions shown; findings below may reference images not displayed]

FINDINGS: Lower chest: Clear lung bases. Normal heart size without pericardial
or pleural effusion.

Hepatobiliary: Normal liver. Cholecystectomy, without biliary ductal
dilatation.

Pancreas: Normal, without mass or ductal dilatation.

Spleen: Capsular based splenic lesion of 2.0 cm is most consistent
with the nonenhancing minimally complex cyst.

Adrenals/Urinary Tract: Normal adrenal glands. No renal calculi or
hydronephrosis. No hydroureter or ureteric calculi. No bladder
calculi.

Interpolar left renal too small to characterize lesion. No
suspicious renal mass. Good renal collecting system opacification on
delayed images. Good ureteric opacification, without filling defect

No enhancing bladder mass or filling defect on delayed images.

Stomach/Bowel: Normal stomach, without wall thickening. Normal colon
and terminal ileum. Normal small bowel.

Vascular/Lymphatic: Normal caliber of the aorta and branch vessels.
No abdominopelvic adenopathy.

Reproductive: Retroverted uterus. Residual left ovarian follicle or
cyst at 1.9 cm.

Other: No significant free fluid.

Musculoskeletal: No acute osseous abnormality.
IMPRESSION: 1.  No acute process or explanation for hematuria.
2.  Aortic Atherosclerosis ([id number].D).

## 2020-09-12 DIAGNOSIS — M7731 Calcaneal spur, right foot: Secondary | ICD-10-CM | POA: Diagnosis not present

## 2020-09-12 DIAGNOSIS — M79671 Pain in right foot: Secondary | ICD-10-CM | POA: Diagnosis not present

## 2020-10-10 DIAGNOSIS — M722 Plantar fascial fibromatosis: Secondary | ICD-10-CM | POA: Diagnosis not present

## 2020-10-10 DIAGNOSIS — M79672 Pain in left foot: Secondary | ICD-10-CM | POA: Diagnosis not present

## 2020-10-10 DIAGNOSIS — M779 Enthesopathy, unspecified: Secondary | ICD-10-CM | POA: Diagnosis not present

## 2020-11-10 DIAGNOSIS — M25571 Pain in right ankle and joints of right foot: Secondary | ICD-10-CM | POA: Diagnosis not present

## 2021-02-02 DIAGNOSIS — B029 Zoster without complications: Secondary | ICD-10-CM | POA: Diagnosis not present

## 2021-02-02 DIAGNOSIS — E6609 Other obesity due to excess calories: Secondary | ICD-10-CM | POA: Diagnosis not present

## 2021-02-02 DIAGNOSIS — Z6834 Body mass index (BMI) 34.0-34.9, adult: Secondary | ICD-10-CM | POA: Diagnosis not present

## 2021-02-16 DIAGNOSIS — E6609 Other obesity due to excess calories: Secondary | ICD-10-CM | POA: Diagnosis not present

## 2021-02-16 DIAGNOSIS — Z0001 Encounter for general adult medical examination with abnormal findings: Secondary | ICD-10-CM | POA: Diagnosis not present

## 2021-02-16 DIAGNOSIS — B029 Zoster without complications: Secondary | ICD-10-CM | POA: Diagnosis not present

## 2021-02-16 DIAGNOSIS — Z6834 Body mass index (BMI) 34.0-34.9, adult: Secondary | ICD-10-CM | POA: Diagnosis not present

## 2021-02-21 ENCOUNTER — Encounter: Payer: Self-pay | Admitting: Internal Medicine

## 2021-03-24 ENCOUNTER — Other Ambulatory Visit: Payer: Self-pay

## 2021-03-24 ENCOUNTER — Encounter (HOSPITAL_COMMUNITY): Payer: Self-pay

## 2021-03-24 ENCOUNTER — Ambulatory Visit
Admission: EM | Admit: 2021-03-24 | Discharge: 2021-03-24 | Disposition: A | Discharge status: Home / Self Care | Payer: BC Managed Care – PPO

## 2021-03-24 ENCOUNTER — Emergency Department (HOSPITAL_COMMUNITY): Payer: BC Managed Care – PPO

## 2021-03-24 ENCOUNTER — Emergency Department (HOSPITAL_COMMUNITY)
Admission: EM | Admit: 2021-03-24 | Discharge: 2021-03-24 | Disposition: A | Discharge status: Home / Self Care | Payer: BC Managed Care – PPO | Attending: Emergency Medicine | Admitting: Emergency Medicine

## 2021-03-24 DIAGNOSIS — R072 Precordial pain: Secondary | ICD-10-CM | POA: Insufficient documentation

## 2021-03-24 DIAGNOSIS — K219 Gastro-esophageal reflux disease without esophagitis: Secondary | ICD-10-CM

## 2021-03-24 DIAGNOSIS — Z79899 Other long term (current) drug therapy: Secondary | ICD-10-CM | POA: Diagnosis not present

## 2021-03-24 DIAGNOSIS — R079 Chest pain, unspecified: Secondary | ICD-10-CM

## 2021-03-24 LAB — CBC
HCT: 41 % (ref 36.0–46.0)
Hemoglobin: 13.5 g/dL (ref 12.0–15.0)
MCH: 29.1 pg (ref 26.0–34.0)
MCHC: 32.9 g/dL (ref 30.0–36.0)
MCV: 88.4 fL (ref 80.0–100.0)
Platelets: 288 10*3/uL (ref 150–400)
RBC: 4.64 MIL/uL (ref 3.87–5.11)
RDW: 13.4 % (ref 11.5–15.5)
WBC: 8.1 10*3/uL (ref 4.0–10.5)
nRBC: 0 % (ref 0.0–0.2)

## 2021-03-24 LAB — BASIC METABOLIC PANEL
Anion gap: 8 (ref 5–15)
BUN: 20 mg/dL (ref 6–20)
CO2: 24 mmol/L (ref 22–32)
Calcium: 9.8 mg/dL (ref 8.9–10.3)
Chloride: 105 mmol/L (ref 98–111)
Creatinine, Ser: 0.73 mg/dL (ref 0.44–1.00)
GFR, Estimated: >60 mL/min (ref >60)
Glucose, Bld: 119 mg/dL — ABNORMAL HIGH (ref 70–99)
Potassium: 3.5 mmol/L (ref 3.5–5.1)
Sodium: 137 mmol/L (ref 135–145)

## 2021-03-24 LAB — TROPONIN I (HIGH SENSITIVITY)
Troponin I (High Sensitivity): 4 ng/L (ref <18)
Troponin I (High Sensitivity): 4 ng/L (ref <18)

## 2021-03-24 MED ORDER — ALUM & MAG HYDROXIDE-SIMETH 200-200-20 MG/5ML PO SUSP
30.0000 mL | Freq: Once | ORAL | Status: AC
  Administered 2021-03-24: 30 mL via ORAL

## 2021-03-24 MED ORDER — LIDOCAINE VISCOUS HCL 2 % MT SOLN: 15.0000 mL | Freq: Once | OROMUCOSAL | Status: DC

## 2021-03-24 MED ORDER — ASPIRIN 81 MG PO CHEW
324.0000 mg | CHEWABLE_TABLET | Freq: Once | ORAL | Status: AC
  Administered 2021-03-24: 324 mg via ORAL
  Filled 2021-03-24: qty 4

## 2021-03-24 NOTE — ED Triage Notes (Signed)
CP started at 10am that is pressure/squeezing pain. CP present if she moves.  Sent from [facility]

## 2021-03-24 NOTE — ED Provider Notes (Signed)
[facility] Provider Note   CSN: [id number] Arrival date & time: 03/24/21  1613     History  Chief Complaint  Patient presents with   Chest Pain    Tracie Cochran is a 51 y.o. female.  She is here for evaluation of chest pain.  She said she had a sharp stabbing substernal chest pain that occurred around 1030 today while she was making lunch.  It lasted for about 30 minutes.  Now she just feels sore when she moves her left arm.  She rated it at 8 out of 10.  Not associated with any shortness of breath diaphoresis nausea vomiting dizziness.  No prior history of same.  No known cardiac disease.  She said she had a stress test in 2017.  Non-smoker.  No recent travel or immobilization.  Not on any hormone therapy.  The history is provided by the patient.  Chest Pain Pain location:  Substernal area Pain quality: aching and stabbing   Pain radiates to:  Does not radiate Pain severity:  Severe Onset quality:  Sudden Duration:  30 minutes Timing:  Constant Progression:  Resolved Chronicity:  New Relieved by:  Rest Worsened by:  Nothing Ineffective treatments:  None tried Associated symptoms: no abdominal pain, no back pain, no cough, no diaphoresis, no fever, no nausea, no numbness, no shortness of breath, no vomiting and no weakness   Risk factors: high cholesterol   Risk factors: no aortic disease, no birth control, no coronary artery disease, no diabetes mellitus, no hypertension, no prior DVT/PE and no smoking    HPI: A 51 year old patient with a history of hypercholesterolemia presents for evaluation of chest pain. Initial onset of pain was more than 6 hours ago. The patient's chest pain is described as heaviness/pressure/tightness, is sharp and is not worse with exertion. The patient's chest pain is middle- or left-sided, is not well-localized and does not radiate to the arms/jaw/neck. The patient does not complain of nausea and denies diaphoresis. The patient has  a family history of coronary artery disease in a first-degree relative with onset less than age 28. The patient has no history of stroke, has no history of peripheral artery disease, has not smoked in the past 90 days, denies any history of treated diabetes, is not hypertensive and does not have an elevated BMI (>=30).   Home Medications Prior to Admission medications   Medication Sig Start Date End Date Taking? Authorizing Provider  Azelastine HCl 0.15 % SOLN Inhale 2 sprays into the lungs daily. 07/16/18  Yes [provider]  cetirizine (ZYRTEC) 10 MG tablet Take 10 mg by mouth daily.    [provider]  Cholecalciferol 50 MCG (2000 UT) TABS Take 2,000 Units by mouth daily.    [provider]  diclofenac (VOLTAREN) 75 MG EC tablet Take 75 mg by mouth 2 (two) times daily. 03/22/21   [provider]  fluticasone (FLONASE) 50 MCG/ACT nasal spray Place 1 spray into both nostrils daily.    [provider]  ibuprofen (ADVIL,MOTRIN) 200 MG tablet Take 800 mg by mouth every 6 (six) hours as needed for moderate pain.    [provider]  Multiple Vitamins-Minerals (ONE DAILY FOR WOMEN 50+ ADV PO) Take 1 tablet by mouth daily.    [provider]  omeprazole (PRILOSEC) 40 MG capsule Take 1 capsule by mouth daily. 02/21/15   [provider]  rosuvastatin (CRESTOR) 10 MG tablet Take 10 mg by mouth daily.  [provider]  rosuvastatin (CRESTOR) 5 MG tablet Take 5 mg by mouth daily. 11/23/15   [provider]  valACYclovir (VALTREX) 500 MG tablet Take 1,000 mg by mouth 2 (two) times daily. 02/02/21   [provider]      Allergies    Patient has no known allergies.    Review of Systems   Review of Systems  Constitutional:  Negative for diaphoresis and fever.  HENT:  Negative for sore throat.   Eyes:  Negative for visual disturbance.  Respiratory:  Negative for cough and shortness of breath.   Cardiovascular:   Positive for chest pain.  Gastrointestinal:  Negative for abdominal pain, nausea and vomiting.  Genitourinary:  Negative for dysuria.  Musculoskeletal:  Negative for back pain.  Skin:  Negative for rash.  Neurological:  Negative for weakness and numbness.   Physical Exam Updated Vital Signs BP (!) 157/92 (BP Location: Right Arm)    Pulse 79    Temp 98 F (36.7 C) (Oral)    Resp 18    Wt 80.3 kg    SpO2 99%    BMI 34.57 kg/m  Physical Exam Vitals and nursing note reviewed.  Constitutional:      General: She is not in acute distress.    Appearance: She is well-developed.  HENT:     Head: Normocephalic and atraumatic.  Eyes:     Conjunctiva/sclera: Conjunctivae normal.  Cardiovascular:     Rate and Rhythm: Normal rate and regular rhythm.     Heart sounds: Normal heart sounds. No murmur heard. Pulmonary:     Effort: Pulmonary effort is normal. No respiratory distress.     Breath sounds: Normal breath sounds.  Abdominal:     Palpations: Abdomen is soft.     Tenderness: There is no abdominal tenderness.  Musculoskeletal:        General: No swelling. Normal range of motion.     Cervical back: Neck supple.     Right lower leg: No tenderness. No edema.     Left lower leg: No tenderness. No edema.  Skin:    General: Skin is warm and dry.     Capillary Refill: Capillary refill takes less than 2 seconds.  Neurological:     General: No focal deficit present.     Mental Status: She is alert.    ED Results / Procedures / Treatments   Labs (all labs ordered are listed, but only abnormal results are displayed) Labs Reviewed  BASIC METABOLIC PANEL - Abnormal; Notable for the following components:      Result Value   Glucose, Bld 119 (*)    All other components within normal limits  CBC  TROPONIN I (HIGH SENSITIVITY)  TROPONIN I (HIGH SENSITIVITY)    EKG EKG Interpretation  Date/Time:  Saturday March 24 2021 16:34:06 EST Ventricular Rate:  82 PR Interval:  152 QRS  Duration: 87 QT Interval:  368 QTC Calculation: 430 R Axis:   55 Text Interpretation: Sinus rhythm Low voltage, precordial leads No old tracing to compare Confirmed by Aletta Edouard [phone]) on 03/24/2021 4:38:27 PM  Radiology DG Chest Port 1 View  Result Date: 03/24/2021 CLINICAL DATA:  Chest pain EXAM: PORTABLE CHEST 1 VIEW COMPARISON:  None. FINDINGS: Normal mediastinum and cardiac silhouette. Normal pulmonary vasculature. No evidence of effusion, infiltrate, or pneumothorax. No acute bony abnormality. IMPRESSION: No acute cardiopulmonary process. Electronically Signed   By: Suzy Bouchard M.D.   On: 03/24/2021 17:23    Procedures  Procedures    Medications Ordered in ED Medications  aspirin chewable tablet 324 mg (has no administration in time range)    ED Course/ Medical Decision Making/ A&P Clinical Course as of 03/24/21 1741  Sat Mar 24, 2021  1703 Chest x-ray interpreted by me as no acute infiltrates.  Awaiting radiology reading. [MB]  [id number] Well score is 0 points [MB]    Clinical Course User Index [MB] Hayden Rasmussen, MD   HEAR Score: 2                       Medical Decision Making Amount and/or Complexity of Data Reviewed Labs: ordered. Radiology: ordered.  Risk OTC drugs.  This patient complains of sternal chest pain; this involves an extensive number of treatment Options and is a complaint that carries with it a high risk of complications and morbidity. The differential includes ACS, angina, musculoskeletal, GERD, pneumonia, PE, pneumothorax, vascular  I ordered, reviewed and interpreted labs, which included CBC with normal white count normal hemoglobin, chemistries normal other than mildly elevated glucose, troponins flat I ordered medication oral aspirin and reviewed PMP when indicated. I ordered imaging studies which included chest x-ray and I independently    visualized and interpreted imaging which showed acute findings Previous records obtained and  reviewed in epic no recent visits, did review prior cardiology note 2017 Cardiac monitoring reviewed, sinus rhythm Social determinants considered, no significant barriers Critical Interventions: None  After the interventions stated above, I reevaluated the patient and found patient to be pain-free and with stable vital signs. Admission and further testing considered, no indications for admission at this time.  Feel she is safe for outpatient follow-up with a low hear score.  She is agreeable to plan for outpatient follow-up.  Return instructions discussed.          Final Clinical Impression(s) / ED Diagnoses Final diagnoses:  Nonspecific chest pain    Rx / DC Orders ED Discharge Orders     None         Hayden Rasmussen, MD 03/25/21 [phone]

## 2021-03-24 NOTE — Discharge Instructions (Signed)
Directly to the emergency department for further evaluation of your chest pain.  We do not recommend that you drive yourself.

## 2021-03-24 NOTE — ED Triage Notes (Signed)
Patient states she started having chest pains this morning with some indigestion  Patient states she took OTC indigestion meds and TUMS  Patient states family has history of heart disease

## 2021-03-24 NOTE — Discharge Instructions (Signed)
You were seen in the emergency department for an episode of chest pain earlier today.  You had blood work EKG chest x-ray that did not show any evidence of heart injury.  Please continue your regular medications and follow-up with your primary care doctor and cardiology.  Return to the emergency department if any worsening or concerning symptoms.

## 2021-03-29 ENCOUNTER — Ambulatory Visit: Payer: BC Managed Care – PPO | Admitting: Internal Medicine

## 2021-03-29 ENCOUNTER — Encounter: Payer: Self-pay | Admitting: Internal Medicine

## 2021-03-29 ENCOUNTER — Other Ambulatory Visit: Payer: Self-pay

## 2021-03-29 VITALS — BP 150/94 | HR 84 | Ht 59.0 in | Wt 181.2 lb

## 2021-03-29 DIAGNOSIS — Z13220 Encounter for screening for lipoid disorders: Secondary | ICD-10-CM

## 2021-03-29 NOTE — Patient Instructions (Signed)
Medication Instructions:  ?Your physician recommends that you continue on your current medications as directed. Please refer to the Current Medication list given to you today. ? ?*If you need a refill on your cardiac medications before your next appointment, please call your pharmacy* ? ? ?Lab Work: ?Your physician recommends that you return for lab work in: 2 Months  ? ?If you have labs (blood work) drawn today and your tests are completely normal, you will receive your results only by: ?MyChart Message (if you have MyChart) OR ?A paper copy in the mail ?If you have any lab test that is abnormal or we need to change your treatment, we will call you to review the results. ? ? ?Testing/Procedures: ?NONE  ? ? ?Follow-Up: ?At [facility] [place], you and your health needs are our priority.  As part of our continuing mission to provide you with exceptional heart care, we have created designated Provider Care Teams.  These Care Teams include your primary Cardiologist (physician) and Advanced Practice Providers (APPs -  Physician Assistants and Nurse Practitioners) who all work together to provide you with the care you need, when you need it. ? ?We recommend signing up for the patient portal called "MyChart".  Sign up information is provided on this After Visit Summary.  MyChart is used to connect with patients for Virtual Visits (Telemedicine).  Patients are able to view lab/test results, encounter notes, upcoming appointments, etc.  Non-urgent messages can be sent to your provider as well.   ?To learn more about what you can do with MyChart, go to [email or web address].   ? ?Your next appointment:   ?1 year(s) ? ?The format for your next appointment:   ?In Person ? ?Provider:   ?Dietrich Pates, MD  ? ? ?Other Instructions ?Thank you for choosing [facility]! ? ? ? ?

## 2021-03-29 NOTE — Progress Notes (Signed)
? ?Cardiology Office Note ? ? ?Date:  03/29/2021  ? ?ID:  Stoney Bang, DOB [birth year], MRN [id number] ? ?PCP:  [facility]  ?Cardiologist:   Dietrich Pates, MD  ? ? ?Patient presents for evaluation of chest pain. ?  ?History of Present Illness: ?Tracie Cochran is a 51 y.o. female with no known cardiac history.  She was seen in the emergency room at [facility] back on February 25.  She was making lunch at the time and developed a sharp stabbing sensation in her chest.  Lasted about 30 minutes.  Sore when she moves her left arm rated it at about 8 out of 10 in severity.  No shortness of breath. ? ?The patient was seen in the emergency room.  Pain was felt to be more muscular.  Pain was worse with movement of upper chest.  Patient was sent home.Since ER visit has not had any more ? 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 ? ?Diet ?Br   Eats at 8:30 a.m.  Sausage b  ?Supper   Chicken or tacos     Salad ?DrinK   Diet pepsi     ?Snacks cookies.  Ice ice cream ? ? ?Current Meds  ?Medication Sig  ? Azelastine HCl 0.15 % SOLN Inhale 2 sprays into the lungs daily.  ? cetirizine (ZYRTEC) 10 MG tablet Take 10 mg by mouth daily.  ? Cholecalciferol 50 MCG (2000 UT) TABS Take 2,000 Units by mouth daily.  ? diclofenac (VOLTAREN) 75 MG EC tablet Take 75 mg by mouth 2 (two) times daily.  ? fluticasone (FLONASE) 50 MCG/ACT nasal spray Place 1 spray into both nostrils daily.  ? ibuprofen (ADVIL,MOTRIN) 200 MG tablet Take 800 mg by mouth every 6 (six) hours as needed for moderate pain.  ? Multiple Vitamins-Minerals (ONE DAILY FOR WOMEN 50+ ADV PO) Take 1 tablet by mouth daily.  ? omeprazole (PRILOSEC) 40 MG capsule Take 1 capsule by mouth daily.  ? rosuvastatin (CRESTOR) 10 MG tablet Take 10 mg by mouth daily.  ? valACYclovir (VALTREX) 500 MG tablet Take 1,000 mg by mouth 2 (two) times daily.  ? ? ? ?Allergies:   Patient  has no known allergies.  ? ?Past Medical History:  ?Diagnosis Date  ? Coronary artery disease   ? Diabetes mellitus without complication (HCC)   ? Hematuria   ? Hyperlipidemia   ? ? ?Past Surgical History:  ?Procedure Laterality Date  ? cesaerian    ? CHOLECYSTECTOMY    ? ? ? ?Social History:  The patient  reports that she has never smoked. She has never used smokeless tobacco. She reports that she does not drink alcohol and does not use drugs.  ? ?Family History:  The patient's family history includes CAD in an other family member; Diabetes in her father and mother.  ? ? ?ROS:  Please see the history of present illness. All other systems are reviewed and  Negative to the above problem except as noted.  ? ? ?PHYSICAL EXAM: ?VS:  BP (!) 150/94   Pulse 84   Ht 4\' 11"  (1.499 m)   Wt 181 lb 3.2 oz (82.2 kg)   SpO2 99%   BMI 36.60 kg/m?   ?GEN: Morbidly obese 51 year old in no acute distress  ?HEENT: normal  ?Neck: no JVD, carotid bruits, ?Cardiac: RRR; no murmurs, no lower  extremity edema  ?Respiratory:  clear to auscultation bilaterally, ?GI: soft, nontender, nondistended, + BS  No hepatomegaly  ?MS: no deformity Moving all extremities   ?Skin: warm and dry, no rash ?Neuro:  Strength and sensation are intact ?Psych: euthymic mood, full affect ? ? ?EKG:  EKG is not ordered today.  On ED visit (03/24/2020) normal sinus rhythm. ? ? ?Lipid Panel ?No results found for: CHOL, TRIG, HDL, CHOLHDL, VLDL, LDLCALC, LDLDIRECT ?  ? ?Wt Readings from Last 3 Encounters:  ?03/29/21 181 lb 3.2 oz (82.2 kg)  ?03/24/21 177 lb (80.3 kg)  ?11/28/15 164 lb (74.4 kg)  ?  ? ? ?ASSESSMENT AND PLAN: ? ?1.  Chest pain.  Atypical for cardiac.  It sounds more musculoskeletal.  I would follow. ? ?2 hypertension.  Blood pressure is high today.  She says it is better at work she may have a whitecoat effect.  I have asked her to visit the PA/nurse practitioner working get checked closely.  I have asked her to call if it is in the 140s or above at  work will need change in medical therapy. ? ?Healthcare maintenance.  Lipids fair LDL 94.  HDL 64. ? ?4.  Morbid obesity.  Discussed diet.  Her A1c is 6.0.  I think she can do better with fewer carbs more protein lots of veggies.  Low-carb. ? ?We will plan for follow-up next winter to see how she was doing with changes.  She should call if her symptoms change or worsen. ? ? ?Current medicines are reviewed at length with the patient today.  The patient does not have concerns regarding medicines. ? ?Signed, ?Dietrich Pates, MD  ?03/29/2021 10:30 AM    ?[facility] ?[address] [place], [place], [place]  [address] ?Phone: [phone]; Fax: [phone]  ? ? ?

## 2021-04-11 NOTE — ED Provider Notes (Signed)
[facility]    CSN: [id number] Arrival date & time: 03/24/21  1521      History   Chief Complaint Chief Complaint  Patient presents with   Chest Pain    HPI Tracie Cochran is a 51 y.o. female.   Presenting today with 1 day history of mid and left-sided chest pain that started this morning.  She states this happens off and on for her but much worse since this morning.  She does have a history of GERD, took some omeprazole and Tums this morning with no benefit.  She has been belching more often since onset of symptoms.  She also notes some occasional chest tightness and shortness of breath since this morning.  Denies diaphoresis, nausea, vomiting, dizziness, syncope, palpitations, injury to the area.  History of hyperlipidemia, diabetes, CAD and a family history of CAD.  Non-smoker.     Past Medical History:  Diagnosis Date   Coronary artery disease    Diabetes mellitus without complication (HCC)    Hematuria    Hyperlipidemia     Patient Active Problem List   Diagnosis Date Noted   Microhematuria 05/21/2019    Past Surgical History:  Procedure Laterality Date   cesaerian     CHOLECYSTECTOMY      OB History     Gravida  1   Para      Term      Preterm      AB      Living         SAB      IAB      Ectopic      Multiple      Live Births               Home Medications    Prior to Admission medications   Medication Sig Start Date End Date Taking? Authorizing Provider  Azelastine HCl 0.15 % SOLN Inhale 2 sprays into the lungs daily. 07/16/18   [provider]  cetirizine (ZYRTEC) 10 MG tablet Take 10 mg by mouth daily.    [provider]  Cholecalciferol 50 MCG (2000 UT) TABS Take 2,000 Units by mouth daily.    [provider]  diclofenac (VOLTAREN) 75 MG EC tablet Take 75 mg by mouth 2 (two) times daily. 03/22/21   [provider]  fluticasone (FLONASE) 50 MCG/ACT nasal spray Place 1 spray into  both nostrils daily.    [provider]  ibuprofen (ADVIL,MOTRIN) 200 MG tablet Take 800 mg by mouth every 6 (six) hours as needed for moderate pain.    [provider]  Multiple Vitamins-Minerals (ONE DAILY FOR WOMEN 50+ ADV PO) Take 1 tablet by mouth daily.    [provider]  omeprazole (PRILOSEC) 40 MG capsule Take 1 capsule by mouth daily. 02/21/15   [provider]  rosuvastatin (CRESTOR) 10 MG tablet Take 10 mg by mouth daily.    [provider]  valACYclovir (VALTREX) 500 MG tablet Take 1,000 mg by mouth 2 (two) times daily. 02/02/21   [provider]    Family History Family History  Problem Relation Age of Onset   CAD Other    Diabetes Father    Diabetes Mother     Social History Social History   Tobacco Use   Smoking status: Never   Smokeless tobacco: Never  Vaping Use   Vaping Use: Never used  Substance Use Topics   Alcohol use: No  Alcohol/week: 0.0 standard drinks   Drug use: No     Allergies   Patient has no known allergies.   Review of Systems Review of Systems Per HPI  Physical Exam Triage Vital Signs ED Triage Vitals  Enc Vitals Group     BP 03/24/21 1527 (!) 178/85     Pulse Rate 03/24/21 1527 83     Resp --      Temp 03/24/21 1527 97.9 F (36.6 C)     Temp Source 03/24/21 1527 Oral     SpO2 03/24/21 1527 98 %     Weight --      Height --      Head Circumference --      Peak Flow --      Pain Score 03/24/21 1529 0     Pain Loc --      Pain Edu? --      Excl. in GC? --    No data found.  Updated Vital Signs BP (!) 178/85 (BP Location: Right Arm)   Pulse 83   Temp 97.9 F (36.6 C) (Oral)   SpO2 98%   Breastfeeding No   Visual Acuity Right Eye Distance:   Left Eye Distance:   Bilateral Distance:    Right Eye Near:   Left Eye Near:    Bilateral Near:     Physical Exam Vitals and nursing note reviewed.  Constitutional:      Appearance: Normal appearance. She is not  ill-appearing.  HENT:     Head: Atraumatic.     Mouth/Throat:     Mouth: Mucous membranes are moist.  Eyes:     Extraocular Movements: Extraocular movements intact.     Conjunctiva/sclera: Conjunctivae normal.  Cardiovascular:     Rate and Rhythm: Normal rate and regular rhythm.     Heart sounds: Normal heart sounds.  Pulmonary:     Effort: Pulmonary effort is normal.     Breath sounds: Normal breath sounds.  Abdominal:     General: There is no distension.     Palpations: Abdomen is soft.     Tenderness: There is no abdominal tenderness. There is no guarding.  Musculoskeletal:        General: Normal range of motion.     Cervical back: Normal range of motion and neck supple.  Skin:    General: Skin is warm and dry.  Neurological:     Mental Status: She is alert and oriented to person, place, and time.  Psychiatric:        Mood and Affect: Mood normal.        Thought Content: Thought content normal.        Judgment: Judgment normal.     UC Treatments / Results  Labs (all labs ordered are listed, but only abnormal results are displayed) Labs Reviewed - No data to display  EKG   Radiology No results found.  Procedures Procedures (including critical care time)  Medications Ordered in UC Medications  alum & mag hydroxide-simeth (MAALOX/MYLANTA) 200-200-20 MG/5ML suspension 30 mL (30 mLs Oral Given 03/24/21 1541)    Initial Impression / Assessment and Plan / UC Course  I have reviewed the triage vital signs and the nursing notes.  Pertinent labs & imaging results that were available during my care of the patient were reviewed by me and considered in my medical decision making (see chart for details).     Hypertensive in triage, otherwise vital signs reassuring.  GI cocktail administered in  triage with no significant resolution of her chest pain.  EKG normal sinus rhythm 81 bpm with some T wave changes in the septal leads.  This is new from prior EKG on file from  2016.  Discussed with patient that given her symptoms, medical history and family history and EKG changes would benefit from a further work-up in the emergency department.  She is agreeable and will call somebody to help transport private vehicle to the hospital.  She is hemodynamically stable at this time for transport.  Final Clinical Impressions(s) / UC Diagnoses   Final diagnoses:  Chest pain, unspecified type  Gastroesophageal reflux disease without esophagitis     Discharge Instructions      Directly to the emergency department for further evaluation of your chest pain.  We do not recommend that you drive yourself.    ED Prescriptions   None    PDMP not reviewed this encounter.   Roosvelt Maser [place], [place] 04/11/21 [phone]

## 2021-04-19 ENCOUNTER — Other Ambulatory Visit: Payer: Self-pay

## 2021-04-19 ENCOUNTER — Ambulatory Visit: Payer: Self-pay

## 2021-09-10 DIAGNOSIS — L7211 Pilar cyst: Secondary | ICD-10-CM | POA: Diagnosis not present

## 2022-02-03 ENCOUNTER — Encounter: Payer: Self-pay | Admitting: Emergency Medicine

## 2022-02-03 ENCOUNTER — Ambulatory Visit
Admission: EM | Admit: 2022-02-03 | Discharge: 2022-02-03 | Disposition: A | Discharge status: Home / Self Care | Payer: BC Managed Care – PPO | Attending: Nurse Practitioner | Admitting: Nurse Practitioner

## 2022-02-03 ENCOUNTER — Ambulatory Visit (INDEPENDENT_AMBULATORY_CARE_PROVIDER_SITE_OTHER): Payer: BC Managed Care – PPO

## 2022-02-03 DIAGNOSIS — R1031 Right lower quadrant pain: Secondary | ICD-10-CM | POA: Insufficient documentation

## 2022-02-03 DIAGNOSIS — M545 Low back pain, unspecified: Secondary | ICD-10-CM | POA: Diagnosis not present

## 2022-02-03 DIAGNOSIS — R109 Unspecified abdominal pain: Secondary | ICD-10-CM | POA: Diagnosis not present

## 2022-02-03 LAB — POCT URINALYSIS DIP (MANUAL ENTRY)
Bilirubin, UA: NEGATIVE
Glucose, UA: NEGATIVE mg/dL
Ketones, POC UA: NEGATIVE mg/dL
Leukocytes, UA: NEGATIVE
Nitrite, UA: NEGATIVE
Protein Ur, POC: NEGATIVE mg/dL
Spec Grav, UA: 1.01 (ref 1.010–1.025)
Urobilinogen, UA: 0.2 E.U./dL
pH, UA: 5.5 (ref 5.0–8.0)

## 2022-02-03 MED ORDER — TAMSULOSIN HCL 0.4 MG PO CAPS
0.4000 mg | ORAL_CAPSULE | Freq: Every day | ORAL | 0 refills | Status: DC
Start: 2022-02-03 — End: 2022-04-22

## 2022-02-03 MED ORDER — KETOROLAC TROMETHAMINE 30 MG/ML IJ SOLN
30.0000 mg | Freq: Once | INTRAMUSCULAR | Status: AC
  Administered 2022-02-03: 30 mg via INTRAMUSCULAR

## 2022-02-03 NOTE — Discharge Instructions (Addendum)
The urinalysis is negative for a urinary tract infection.  The x-ray does not show an obvious kidney stone. Take medication as prescribed. A urine strainer has been provided for you to strain your urine to determine if you passed a possible kidney stone. Continue use of over-the-counter ibuprofen as needed for pain or discomfort. Continue to drink at least 8-10 8 ounce glasses of water daily while symptoms persist. Please go to the emergency department for further evaluation if pain does not improve or becomes more severe. Follow-up with your primary care physician for further evaluation. Follow-up as needed.

## 2022-02-03 NOTE — ED Provider Notes (Signed)
[facility]    CSN: [id number] Arrival date & time: 02/03/22  1125      History   Chief Complaint No chief complaint on file.   HPI Tracie Cochran is a 52 y.o. female.   The history is provided by the patient.   The patient presents for complaints of low back pain that radiates to the right side has been present over the past 2 days.  Patient states that she also feels pressure in her lower pelvic region along with urinary frequency.  She states that her pain worsens when she sits or bends over.  She states when the pain presents, it "stops me" and she describes the pain as sharp.  She states pain is intermittent, and right now it is "not that bad".  She denies injury, trauma, fever, chills, abdominal pain, dysuria, hematuria, decreased urine stream, or cloudy urine.  Patient denies history of kidney stones or urinary tract infections.  Patient reports that she took ibuprofen 800 mg for her symptoms earlier this morning.  Past Medical History:  Diagnosis Date   Coronary artery disease    Diabetes mellitus without complication ([place])    Hematuria    Hyperlipidemia     Patient Active Problem List   Diagnosis Date Noted   Microhematuria 05/21/2019    Past Surgical History:  Procedure Laterality Date   cesaerian     CHOLECYSTECTOMY      OB History     Gravida  1   Para      Term      Preterm      AB      Living         SAB      IAB      Ectopic      Multiple      Live Births               Home Medications    Prior to Admission medications   Medication Sig Start Date End Date Taking? Authorizing Provider  tamsulosin (FLOMAX) 0.4 MG CAPS capsule Take 1 capsule (0.4 mg total) by mouth daily after supper. 02/03/22  Yes Kentavius Dettore-Warren, Alda Lea, NP  Azelastine HCl 0.15 % SOLN Inhale 2 sprays into the lungs daily. 07/16/18   [provider]  cetirizine (ZYRTEC) 10 MG tablet Take 10 mg by mouth daily.    [provider]   Cholecalciferol 50 MCG (2000 UT) TABS Take 2,000 Units by mouth daily.    [provider]  diclofenac (VOLTAREN) 75 MG EC tablet Take 75 mg by mouth 2 (two) times daily. 03/22/21   [provider]  fluticasone (FLONASE) 50 MCG/ACT nasal spray Place 1 spray into both nostrils daily.    [provider]  ibuprofen (ADVIL,MOTRIN) 200 MG tablet Take 800 mg by mouth every 6 (six) hours as needed for moderate pain.    [provider]  Multiple Vitamins-Minerals (ONE DAILY FOR WOMEN 50+ ADV PO) Take 1 tablet by mouth daily.    [provider]  omeprazole (PRILOSEC) 40 MG capsule Take 1 capsule by mouth daily. 02/21/15   [provider]  rosuvastatin (CRESTOR) 10 MG tablet Take 10 mg by mouth daily.    [provider]  valACYclovir (VALTREX) 500 MG tablet Take 1,000 mg by mouth 2 (two) times daily. 02/02/21   [provider]    Family History Family History  Problem Relation Age of Onset   CAD Other  Diabetes Father    Diabetes Mother     Social History Social History   Tobacco Use   Smoking status: Never   Smokeless tobacco: Never  Vaping Use   Vaping Use: Never used  Substance Use Topics   Alcohol use: No    Alcohol/week: 0.0 standard drinks of alcohol   Drug use: No     Allergies   Patient has no known allergies.   Review of Systems Review of Systems Per HPI  Physical Exam Triage Vital Signs ED Triage Vitals  Enc Vitals Group     BP 02/03/22 1255 (!) 165/99     Pulse Rate 02/03/22 1255 96     Resp 02/03/22 1255 20     Temp 02/03/22 1255 98.5 F (36.9 C)     Temp Source 02/03/22 1255 Oral     SpO2 02/03/22 1255 95 %     Weight --      Height --      Head Circumference --      Peak Flow --      Pain Score 02/03/22 1256 8     Pain Loc --      Pain Edu? --      Excl. in GC? --    No data found.  Updated Vital Signs BP (!) 165/99 (BP Location: Right Arm)   Pulse 96   Temp 98.5 F (36.9 C)  (Oral)   Resp 20   SpO2 95%   Visual Acuity Right Eye Distance:   Left Eye Distance:   Bilateral Distance:    Right Eye Near:   Left Eye Near:    Bilateral Near:     Physical Exam Vitals and nursing note reviewed.  Constitutional:      General: She is not in acute distress.    Appearance: Normal appearance.  HENT:     Head: Normocephalic.  Eyes:     Extraocular Movements: Extraocular movements intact.     Pupils: Pupils are equal, round, and reactive to light.  Cardiovascular:     Rate and Rhythm: Normal rate and regular rhythm.     Pulses: Normal pulses.     Heart sounds: Normal heart sounds.  Pulmonary:     Effort: Pulmonary effort is normal. No respiratory distress.     Breath sounds: Normal breath sounds. No stridor. No wheezing, rhonchi or rales.  Abdominal:     General: Bowel sounds are normal.     Palpations: Abdomen is soft.     Tenderness: There is no abdominal tenderness.  Musculoskeletal:     Cervical back: Normal range of motion.  Lymphadenopathy:     Cervical: No cervical adenopathy.  Skin:    General: Skin is warm and dry.  Neurological:     General: No focal deficit present.     Mental Status: She is alert and oriented to person, place, and time.  Psychiatric:        Mood and Affect: Mood normal.        Behavior: Behavior normal.      UC Treatments / Results  Labs (all labs ordered are listed, but only abnormal results are displayed) Labs Reviewed  POCT URINALYSIS DIP (MANUAL ENTRY) - Abnormal; Notable for the following components:      Result Value   Color, UA light yellow (*)    Blood, UA trace-intact (*)    All other components within normal limits  URINE CULTURE    EKG   Radiology DG Abd 2  Views  Result Date: 02/03/2022 CLINICAL DATA:  Low back and abdominal pain for 3 days, radiating to right leg EXAM: ABDOMEN - 2 VIEW COMPARISON:  None Available. FINDINGS: Nonobstructive pattern of bowel gas. Moderate burden of stool throughout  colon. There is no evidence of free air. No radio-opaque calculi or other significant radiographic abnormality is seen. Cholecystectomy clips in the right upper quadrant. IMPRESSION: Nonobstructive pattern of bowel gas.  No free air in the abdomen. Electronically Signed   By: Jearld Lesch M.D.   On: 02/03/2022 14:16    Procedures Procedures (including critical care time)  Medications Ordered in UC Medications  ketorolac (TORADOL) 30 MG/ML injection 30 mg (30 mg Intramuscular Given 02/03/22 1343)    Initial Impression / Assessment and Plan / UC Course  I have reviewed the triage vital signs and the nursing notes.  Pertinent labs & imaging results that were available during my care of the patient were reviewed by me and considered in my medical decision making (see chart for details).  The patient is well-appearing, she is in no acute distress, she is hypertensive, but vital signs are otherwise stable.  Urinalysis is positive for blood, urine culture is pending.  Will treat patient for acute low back pain.  Suspect patient may have a possible renal calculi, but cannot determine with plain films.  X-ray of her abdomen did not show an obvious kidney stone.  Will start patient on tamsulosin 0.4 mg to improve her urinary flow.  Supportive care recommendations were provided to the patient to include continuing over-the-counter medications for pain control, and straining her urine with each urinary output.  Discussed return precautions and advise when going to the emergency department may be necessary.  Patient verbalizes understanding.  All questions were answered.  Patient stable for discharge.   Final Clinical Impressions(s) / UC Diagnoses   Final diagnoses:  Midline low back pain without sciatica, unspecified chronicity  Right lower quadrant abdominal pain     Discharge Instructions      The urinalysis is negative for a urinary tract infection.  The x-ray does not show an obvious kidney  stone. Take medication as prescribed. A urine strainer has been provided for you to strain your urine to determine if you passed a possible kidney stone. Continue use of over-the-counter ibuprofen as needed for pain or discomfort. Continue to drink at least 8-10 8 ounce glasses of water daily while symptoms persist. Please go to the emergency department for further evaluation if pain does not improve or becomes more severe. Follow-up with your primary care physician for further evaluation. Follow-up as needed.      ED Prescriptions     Medication Sig Dispense Auth. Provider   tamsulosin (FLOMAX) 0.4 MG CAPS capsule Take 1 capsule (0.4 mg total) by mouth daily after supper. 30 capsule [place] Liz-Warren, Sadie Haber, NP      PDMP not reviewed this encounter.   Abran Cantor, NP 02/03/22 1424

## 2022-02-03 NOTE — ED Triage Notes (Signed)
Lower back pain that radiated to right side since Friday.  Hurts worse when sitting or bending over.

## 2022-02-04 LAB — URINE CULTURE: Culture: NO GROWTH

## 2022-02-08 ENCOUNTER — Ambulatory Visit (HOSPITAL_COMMUNITY)
Admission: RE | Admit: 2022-02-08 | Discharge: 2022-02-08 | Disposition: A | Discharge status: Home / Self Care | Payer: BC Managed Care – PPO | Source: Ambulatory Visit | Attending: Urology | Admitting: Urology

## 2022-02-08 ENCOUNTER — Ambulatory Visit: Payer: BC Managed Care – PPO | Admitting: Urology

## 2022-02-08 VITALS — BP 142/92 | HR 78

## 2022-02-08 DIAGNOSIS — M545 Low back pain, unspecified: Secondary | ICD-10-CM

## 2022-02-08 DIAGNOSIS — N83202 Unspecified ovarian cyst, left side: Secondary | ICD-10-CM

## 2022-02-08 DIAGNOSIS — Z87442 Personal history of urinary calculi: Secondary | ICD-10-CM

## 2022-02-08 DIAGNOSIS — D7389 Other diseases of spleen: Secondary | ICD-10-CM | POA: Diagnosis not present

## 2022-02-08 DIAGNOSIS — R109 Unspecified abdominal pain: Secondary | ICD-10-CM

## 2022-02-08 DIAGNOSIS — R3129 Other microscopic hematuria: Secondary | ICD-10-CM | POA: Diagnosis not present

## 2022-02-08 DIAGNOSIS — N200 Calculus of kidney: Secondary | ICD-10-CM

## 2022-02-08 DIAGNOSIS — K429 Umbilical hernia without obstruction or gangrene: Secondary | ICD-10-CM | POA: Diagnosis not present

## 2022-02-08 MED ORDER — CYCLOBENZAPRINE HCL 5 MG PO TABS
5.0000 mg | ORAL_TABLET | Freq: Three times a day (TID) | ORAL | 0 refills | Status: DC | PRN
Start: 2022-02-08 — End: 2022-04-22

## 2022-02-08 NOTE — Progress Notes (Unsigned)
02/08/2022 2:34 PM   Tracie Cochran [birth year] [id number]  Referring provider: [facility] [address],  [place] [address]  Right flank pain   HPI: Ms Tracie Cochran is a 52yo here for evaluation of right flank pain. She has been having right flank pain that radiates to the right groin for the past week. CT shows no calculi but she does have 3.3cm left ovarian cyst. She denies any significant LUTS. The pain is worse when standing from a sitting position and worse with twisting. No other associated symptoms. NO exacerbating/alleviating events.    PMH: Past Medical History:  Diagnosis Date   Coronary artery disease    Diabetes mellitus without complication ([place])    Hematuria    Hyperlipidemia     Surgical History: Past Surgical History:  Procedure Laterality Date   cesaerian     CHOLECYSTECTOMY      Home Medications:  Allergies as of 02/08/2022   No Known Allergies      Medication List        Accurate as of February 08, 2022  2:34 PM. If you have any questions, ask your nurse or doctor.          Azelastine HCl 0.15 % Soln Inhale 2 sprays into the lungs daily.   cetirizine 10 MG tablet Commonly known as: ZYRTEC Take 10 mg by mouth daily.   Cholecalciferol 50 MCG (2000 UT) Tabs Take 2,000 Units by mouth daily.   cyclobenzaprine 5 MG tablet Commonly known as: FLEXERIL Take 1 tablet (5 mg total) by mouth 3 (three) times daily as needed for muscle spasms.   diclofenac 75 MG EC tablet Commonly known as: VOLTAREN Take 75 mg by mouth 2 (two) times daily.   fluticasone 50 MCG/ACT nasal spray Commonly known as: FLONASE Place 1 spray into both nostrils daily.   ibuprofen 200 MG tablet Commonly known as: ADVIL Take 800 mg by mouth every 6 (six) hours as needed for moderate pain.   omeprazole 40 MG capsule Commonly known as: PRILOSEC Take 1 capsule by mouth daily.   ONE DAILY FOR WOMEN 50+ ADV PO Take 1 tablet by mouth  daily.   rosuvastatin 10 MG tablet Commonly known as: CRESTOR Take 10 mg by mouth daily.   tamsulosin 0.4 MG Caps capsule Commonly known as: FLOMAX Take 1 capsule (0.4 mg total) by mouth daily after supper.   valACYclovir 500 MG tablet Commonly known as: VALTREX Take 1,000 mg by mouth 2 (two) times daily.   Wegovy 0.25 MG/0.5ML Soaj Generic drug: Semaglutide-Weight Management 0.5 mL Subcutaneous once weekly for 28 days        Allergies: No Known Allergies  Family History: Family History  Problem Relation Age of Onset   CAD Other    Diabetes Father    Diabetes Mother     Social History:  reports that she has never smoked. She has never used smokeless tobacco. She reports that she does not drink alcohol and does not use drugs.  ROS: All other review of systems were reviewed and are negative except what is noted above in HPI  Physical Exam: BP (!) 142/92   Pulse 78   Constitutional:  Alert and oriented, No acute distress. HEENT: [place] AT, moist mucus membranes.  Trachea midline, no masses. Cardiovascular: No clubbing, cyanosis, or edema. Respiratory: Normal respiratory effort, no increased work of breathing. GI: Abdomen is soft, nontender, nondistended, no abdominal masses GU: No CVA tenderness.  Lymph: No cervical  or inguinal lymphadenopathy. Skin: No rashes, bruises or suspicious lesions. Neurologic: Grossly intact, no focal deficits, moving all 4 extremities. Psychiatric: Normal mood and affect.  Laboratory Data: Lab Results  Component Value Date   WBC 8.1 03/24/2021   HGB 13.5 03/24/2021   HCT 41.0 03/24/2021   MCV 88.4 03/24/2021   PLT 288 03/24/2021    Lab Results  Component Value Date   CREATININE 0.73 03/24/2021    No results found for: "PSA"  No results found for: "TESTOSTERONE"  No results found for: "HGBA1C"  Urinalysis    Component Value Date/Time   COLORURINE YELLOW 12/11/2018 1248   APPEARANCEUR HAZY (A) 12/11/2018 1248   LABSPEC  1.024 12/11/2018 1248   PHURINE 5.0 12/11/2018 1248   GLUCOSEU NEGATIVE 12/11/2018 1248   HGBUR MODERATE (A) 12/11/2018 1248   BILIRUBINUR negative 02/03/2022 1305   BILIRUBINUR neg 05/21/2019 1522   KETONESUR negative 02/03/2022 1305   KETONESUR NEGATIVE 12/11/2018 1248   PROTEINUR negative 02/03/2022 1305   PROTEINUR Negative 05/21/2019 1522   PROTEINUR NEGATIVE 12/11/2018 1248   UROBILINOGEN 0.2 02/03/2022 1305   NITRITE Negative 02/03/2022 1305   NITRITE neg 05/21/2019 1522   NITRITE NEGATIVE 12/11/2018 1248   LEUKOCYTESUR Negative 02/03/2022 1305   LEUKOCYTESUR NEGATIVE 12/11/2018 1248    Lab Results  Component Value Date   BACTERIA FEW (A) 12/11/2018    Pertinent Imaging: CT stone study today: Images reviewed and discussed with the patient  No results found for this or any previous visit.  No results found for this or any previous visit.  No results found for this or any previous visit.  No results found for this or any previous visit.  No results found for this or any previous visit.  No valid procedures specified. Results for orders placed during the hospital encounter of 12/28/18  CT HEMATURIA WORKUP  Narrative CLINICAL DATA:  Asymptomatic microscopic hematuria for 2 months. Cholecystectomy.  EXAM: CT ABDOMEN AND PELVIS WITHOUT AND WITH CONTRAST  TECHNIQUE: Multidetector CT imaging of the abdomen and pelvis was performed following the standard protocol before and following the bolus administration of intravenous contrast.  CONTRAST:  189mL OMNIPAQUE IOHEXOL 300 MG/ML  SOLN  COMPARISON:  None.  FINDINGS: Lower chest: Clear lung bases. Normal heart size without pericardial or pleural effusion.  Hepatobiliary: Normal liver. Cholecystectomy, without biliary ductal dilatation.  Pancreas: Normal, without mass or ductal dilatation.  Spleen: Capsular based splenic lesion of 2.0 cm is most consistent with the nonenhancing minimally complex  cyst.  Adrenals/Urinary Tract: Normal adrenal glands. No renal calculi or hydronephrosis. No hydroureter or ureteric calculi. No bladder calculi.  Interpolar left renal too small to characterize lesion. No suspicious renal mass. Good renal collecting system opacification on delayed images. Good ureteric opacification, without filling defect  No enhancing bladder mass or filling defect on delayed images.  Stomach/Bowel: Normal stomach, without wall thickening. Normal colon and terminal ileum. Normal small bowel.  Vascular/Lymphatic: Normal caliber of the aorta and branch vessels. No abdominopelvic adenopathy.  Reproductive: Retroverted uterus. Residual left ovarian follicle or cyst at 1.9 cm.  Other: No significant free fluid.  Musculoskeletal: No acute osseous abnormality.  IMPRESSION: 1.  No acute process or explanation for hematuria. 2.  Aortic Atherosclerosis (ICD10-I70.0).   Electronically Signed By: Abigail Miyamoto M.D. On: 12/28/2018 13:12  Results for orders placed in visit on 02/08/22  CT RENAL STONE STUDY  Narrative CLINICAL DATA:  Low back pain radiating to the right side for 1 week. Microscopic hematuria.  EXAM: CT ABDOMEN AND PELVIS WITHOUT CONTRAST  TECHNIQUE: Multidetector CT imaging of the abdomen and pelvis was performed following the standard protocol without IV contrast.  RADIATION DOSE REDUCTION: This exam was performed according to the departmental dose-optimization program which includes automated exposure control, adjustment of the mA and/or kV according to patient size and/or use of iterative reconstruction technique.  COMPARISON:  CT abdomen/pelvis 12/28/2018  FINDINGS: Lower chest: The lung bases are clear. The imaged heart is unremarkable.  Hepatobiliary: The liver is unremarkable. The gallbladder is surgically absent.  Pancreas: Unremarkable.  Spleen: The 3.3 cm hypodense lesion in the spleen is increased in size since 2020  common but is still favored to reflect a benign cyst or hemangioma. No specific imaging follow-up is required. The spleen is otherwise unremarkable.  Adrenals/Urinary Tract: The adrenals are unremarkable.  The kidneys are unremarkable, with no focal lesion, stone, hydronephrosis, or hydroureter. No stone is seen along the course of either ureter. The bladder is unremarkable.  Stomach/Bowel: The stomach is unremarkable. There is no evidence of bowel obstruction. There is no abnormal bowel wall thickening or inflammatory change. The appendix is not definitively identified.  Vascular/Lymphatic: The abdominal aorta is normal in course and caliber. There is no abdominal or pelvic lymphadenopathy.  Reproductive: There is a 3.3 cm left ovarian cyst. The uterus and right adnexa are unremarkable.  Other: There is no ascites or free air. There is a small fat containing umbilical hernia, unchanged.  Musculoskeletal: There is no acute osseous abnormality or suspicious osseous lesion.  IMPRESSION: 1. No acute finding in the abdomen or pelvis to explain the patient's symptoms. No renal stones or hydronephrosis. 2. 3.3 cm left ovarian cyst. Recommend nonemergent outpatient pelvic ultrasound for better evaluation. 3. Interval increase in size of the hypodense lesion in the spleen since 2020, favored to reflect a benign cyst or hemangioma requiring no specific imaging follow-up.   Electronically Signed By: Lesia Hausen M.D. On: 02/08/2022 14:27   Assessment & Plan:    1. Nephrolithiasis -CT shows no calculi - CT RENAL STONE STUDY  2. Back pain -flexeril 5mg  TID prn   No follow-ups on file.  , MD  [facility] [place]

## 2022-02-12 ENCOUNTER — Encounter: Payer: Self-pay | Admitting: Urology

## 2022-02-12 NOTE — Patient Instructions (Signed)
Acute Back Pain, Adult Acute back pain is sudden and usually short-lived. It is often caused by an injury to the muscles and tissues in the back. The injury may result from: A muscle, tendon, or ligament getting overstretched or torn. Ligaments are tissues that connect bones to each other. Lifting something improperly can cause a back strain. Wear and tear (degeneration) of the spinal disks. Spinal disks are circular tissue that provide cushioning between the bones of the spine (vertebrae). Twisting motions, such as while playing sports or doing yard work. A hit to the back. Arthritis. You may have a physical exam, lab tests, and imaging tests to find the cause of your pain. Acute back pain usually goes away with rest and home care. Follow these instructions at home: Managing pain, stiffness, and swelling Take over-the-counter and prescription medicines only as told by your health care provider. Treatment may include medicines for pain and inflammation that are taken by mouth or applied to the skin, or muscle relaxants. Your health care provider may recommend applying ice during the first 24-48 hours after your pain starts. To do this: Put ice in a plastic bag. Place a towel between your skin and the bag. Leave the ice on for 20 minutes, 2-3 times a day. Remove the ice if your skin turns bright red. This is very important. If you cannot feel pain, heat, or cold, you have a greater risk of damage to the area. If directed, apply heat to the affected area as often as told by your health care provider. Use the heat source that your health care provider recommends, such as a moist heat pack or a heating pad. Place a towel between your skin and the heat source. Leave the heat on for 20-30 minutes. Remove the heat if your skin turns bright red. This is especially important if you are unable to feel pain, heat, or cold. You have a greater risk of getting burned. Activity  Do not stay in bed. Staying in  bed for more than 1-2 days can delay your recovery. Sit up and stand up straight. Avoid leaning forward when you sit or hunching over when you stand. If you work at a desk, sit close to it so you do not need to lean over. Keep your chin tucked in. Keep your neck drawn back, and keep your elbows bent at a 90-degree angle (right angle). Sit high and close to the steering wheel when you drive. Add lower back (lumbar) support to your car seat, if needed. Take short walks on even surfaces as soon as you are able. Try to increase the length of time you walk each day. Do not sit, drive, or stand in one place for more than 30 minutes at a time. Sitting or standing for long periods of time can put stress on your back. Do not drive or use heavy machinery while taking prescription pain medicine. Use proper lifting techniques. When you bend and lift, use positions that put less stress on your back: [place] your knees. Keep the load close to your body. Avoid twisting. Exercise regularly as told by your health care provider. Exercising helps your back heal faster and helps prevent back injuries by keeping muscles strong and flexible. Work with a physical therapist to make a safe exercise program, as recommended by your health care provider. Do any exercises as told by your physical therapist. Lifestyle Maintain a healthy weight. Extra weight puts stress on your back and makes it difficult to have good  posture. Avoid activities or situations that make you feel anxious or stressed. Stress and anxiety increase muscle tension and can make back pain worse. Learn ways to manage anxiety and stress, such as through exercise. General instructions Sleep on a firm mattress in a comfortable position. Try lying on your side with your knees slightly bent. If you lie on your back, put a pillow under your knees. Keep your head and neck in a straight line with your spine (neutral position) when using electronic equipment like  smartphones or pads. To do this: Raise your smartphone or pad to look at it instead of bending your head or neck to look down. Put the smartphone or pad at the level of your face while looking at the screen. Follow your treatment plan as told by your health care provider. This may include: Cognitive or behavioral therapy. Acupuncture or massage therapy. Meditation or yoga. Contact a health care provider if: You have pain that is not relieved with rest or medicine. You have increasing pain going down into your legs or buttocks. Your pain does not improve after 2 weeks. You have pain at night. You lose weight without trying. You have a fever or chills. You develop nausea or vomiting. You develop abdominal pain. Get help right away if: You develop new bowel or bladder control problems. You have unusual weakness or numbness in your arms or legs. You feel faint. These symptoms may represent a serious problem that is an emergency. Do not wait to see if the symptoms will go away. Get medical help right away. Call your local emergency services (911 in the [place] Do not drive yourself to the hospital. Summary Acute back pain is sudden and usually short-lived. Use proper lifting techniques. When you bend and lift, use positions that put less stress on your back. Take over-the-counter and prescription medicines only as told by your health care provider, and apply heat or ice as told. This information is not intended to replace advice given to you by your health care provider. Make sure you discuss any questions you have with your health care provider. Document Revised: 04/07/2020 Document Reviewed: 04/07/2020 Elsevier Patient Education  2023 Elsevier Inc.  

## 2022-02-22 ENCOUNTER — Ambulatory Visit: Payer: BC Managed Care – PPO | Admitting: Urology

## 2022-02-28 ENCOUNTER — Other Ambulatory Visit (HOSPITAL_COMMUNITY): Payer: Self-pay | Admitting: Family Medicine

## 2022-02-28 DIAGNOSIS — R140 Abdominal distension (gaseous): Secondary | ICD-10-CM

## 2022-02-28 DIAGNOSIS — N83292 Other ovarian cyst, left side: Secondary | ICD-10-CM | POA: Diagnosis not present

## 2022-02-28 DIAGNOSIS — E6609 Other obesity due to excess calories: Secondary | ICD-10-CM | POA: Diagnosis not present

## 2022-02-28 DIAGNOSIS — M545 Low back pain, unspecified: Secondary | ICD-10-CM | POA: Diagnosis not present

## 2022-02-28 DIAGNOSIS — Z6835 Body mass index (BMI) 35.0-35.9, adult: Secondary | ICD-10-CM | POA: Diagnosis not present

## 2022-02-28 DIAGNOSIS — R1084 Generalized abdominal pain: Secondary | ICD-10-CM | POA: Diagnosis not present

## 2022-03-11 ENCOUNTER — Ambulatory Visit (HOSPITAL_COMMUNITY)
Admission: RE | Admit: 2022-03-11 | Discharge: 2022-03-11 | Disposition: A | Discharge status: Home / Self Care | Payer: BC Managed Care – PPO | Source: Ambulatory Visit | Attending: Family Medicine | Admitting: Family Medicine

## 2022-03-11 DIAGNOSIS — R140 Abdominal distension (gaseous): Secondary | ICD-10-CM

## 2022-03-11 DIAGNOSIS — M545 Low back pain, unspecified: Secondary | ICD-10-CM | POA: Diagnosis not present

## 2022-03-11 DIAGNOSIS — N83202 Unspecified ovarian cyst, left side: Secondary | ICD-10-CM | POA: Diagnosis not present

## 2022-03-11 DIAGNOSIS — N83292 Other ovarian cyst, left side: Secondary | ICD-10-CM | POA: Diagnosis not present

## 2022-03-14 DIAGNOSIS — R1084 Generalized abdominal pain: Secondary | ICD-10-CM | POA: Diagnosis not present

## 2022-03-14 DIAGNOSIS — Z0001 Encounter for general adult medical examination with abnormal findings: Secondary | ICD-10-CM | POA: Diagnosis not present

## 2022-03-14 DIAGNOSIS — N83292 Other ovarian cyst, left side: Secondary | ICD-10-CM | POA: Diagnosis not present

## 2022-03-14 DIAGNOSIS — Z6835 Body mass index (BMI) 35.0-35.9, adult: Secondary | ICD-10-CM | POA: Diagnosis not present

## 2022-03-14 DIAGNOSIS — Z1331 Encounter for screening for depression: Secondary | ICD-10-CM | POA: Diagnosis not present

## 2022-03-14 DIAGNOSIS — E6609 Other obesity due to excess calories: Secondary | ICD-10-CM | POA: Diagnosis not present

## 2022-03-14 DIAGNOSIS — R140 Abdominal distension (gaseous): Secondary | ICD-10-CM | POA: Diagnosis not present

## 2022-03-14 DIAGNOSIS — M545 Low back pain, unspecified: Secondary | ICD-10-CM | POA: Diagnosis not present

## 2022-03-29 ENCOUNTER — Encounter: Payer: Self-pay | Admitting: Internal Medicine

## 2022-04-21 NOTE — Progress Notes (Unsigned)
GI Office Note    Referring Provider: Jake Samples, PA* Primary Care Physician:  Scherrie Bateman  Primary Gastroenterologist: Elon Alas. Abbey Chatters, DO  Chief Complaint   No chief complaint on file.   History of Present Illness   Tracie Cochran is a 52 y.o. female presenting today at the request of Jake Samples, [place]* for consult for colonoscopy.   Pelvic US 03/11/22: Left ovarian cyst. Probable small uterine fibroid  PCP visit 03/14/22.C/o abdominal pain and bloating. Miralax cleanse has helped before in the past. PCP suggested low FODMAP diet. Labs: normal LFTs and CBC.  Today: Constipation -   Bloating -   Family history of colon cancer/polyps: Prior TCS:     Current Outpatient Medications  Medication Sig Dispense Refill   Azelastine HCl 0.15 % SOLN Inhale 2 sprays into the lungs daily.     cetirizine (ZYRTEC) 10 MG tablet Take 10 mg by mouth daily.     Cholecalciferol 50 MCG (2000 UT) TABS Take 2,000 Units by mouth daily.     cyclobenzaprine (FLEXERIL) 5 MG tablet Take 1 tablet (5 mg total) by mouth 3 (three) times daily as needed for muscle spasms. 30 tablet 0   diclofenac (VOLTAREN) 75 MG EC tablet Take 75 mg by mouth 2 (two) times daily.     fluticasone (FLONASE) 50 MCG/ACT nasal spray Place 1 spray into both nostrils daily.     ibuprofen (ADVIL,MOTRIN) 200 MG tablet Take 800 mg by mouth every 6 (six) hours as needed for moderate pain.     Multiple Vitamins-Minerals (ONE DAILY FOR WOMEN 50+ ADV PO) Take 1 tablet by mouth daily.     omeprazole (PRILOSEC) 40 MG capsule Take 1 capsule by mouth daily.  0   rosuvastatin (CRESTOR) 10 MG tablet Take 10 mg by mouth daily.     Semaglutide-Weight Management (WEGOVY) 0.25 MG/0.5ML SOAJ 0.5 mL Subcutaneous once weekly for 28 days     tamsulosin (FLOMAX) 0.4 MG CAPS capsule Take 1 capsule (0.4 mg total) by mouth daily after supper. 30 capsule 0   valACYclovir (VALTREX) 500 MG tablet Take 1,000 mg by mouth 2  (two) times daily.     No current facility-administered medications for this visit.    Past Medical History:  Diagnosis Date   Coronary artery disease    Diabetes mellitus without complication ([place])    Hematuria    Hyperlipidemia     Past Surgical History:  Procedure Laterality Date   cesaerian     CHOLECYSTECTOMY      Family History  Problem Relation Age of Onset   CAD Other    Diabetes Father    Diabetes Mother     Allergies as of 04/22/2022   (No Known Allergies)    Social History   Socioeconomic History   Marital status: Divorced    Spouse name: Not on file   Number of children: Not on file   Years of education: Not on file   Highest education level: Not on file  Occupational History   Not on file  Tobacco Use   Smoking status: Never   Smokeless tobacco: Never  Vaping Use   Vaping Use: Never used  Substance and Sexual Activity   Alcohol use: No    Alcohol/week: 0.0 standard drinks of alcohol   Drug use: No   Sexual activity: Not Currently    Birth control/protection: None  Other Topics Concern   Not on file  Social History Narrative  Not on file   Social Determinants of Health   Financial Resource Strain: Not on file  Food Insecurity: Not on file  Transportation Needs: Not on file  Physical Activity: Not on file  Stress: Not on file  Social Connections: Not on file  Intimate Partner Violence: Not on file     Review of Systems  *** Gen: Denies any fever, chills, fatigue, weight loss, lack of appetite.  CV: Denies chest pain, heart palpitations, peripheral edema, syncope.  Resp: Denies shortness of breath at rest or with exertion. Denies wheezing or cough.  GI: see HPI GU : Denies urinary burning, urinary frequency, urinary hesitancy MS: Denies joint pain, muscle weakness, cramps, or limitation of movement.  Derm: Denies rash, itching, dry skin Psych: Denies depression, anxiety, memory loss, and confusion Heme: Denies bruising,  bleeding, and enlarged lymph nodes.   Physical Exam   There were no vitals taken for this visit.  General:   Alert and oriented. Pleasant and cooperative. Well-nourished and well-developed.  Head:  Normocephalic and atraumatic. Eyes:  Without icterus, sclera clear and conjunctiva pink.  Ears:  Normal auditory acuity. Mouth:  No deformity or lesions, oral mucosa pink.  Lungs:  Clear to auscultation bilaterally. No wheezes, rales, or rhonchi. No distress.  Heart:  S1, S2 present without murmurs appreciated.  Abdomen:  +BS, soft, non-tender and non-distended. No HSM noted. No guarding or rebound. No masses appreciated.  Rectal:  Deferred *** Msk:  Symmetrical without gross deformities. Normal posture. Extremities:  Without edema. Neurologic:  Alert and  oriented x4;  grossly normal neurologically. Skin:  Intact without significant lesions or rashes. Psych:  Alert and cooperative. Normal mood and affect.   Assessment   Tracie Cochran is a 52 y.o. female with a history of CAD, diabetes, HLD*** presenting today with   Constipation:   Screening for colon cancer:  PLAN   *** TSH, celiac serologies Proceed with colonoscopy with propofol by Dr. Abbey Chatters  in near future: the risks, benefits, and alternatives have been discussed with the patient in detail. The patient states understanding and desires to proceed.  ASA 2/3??*** Hold Wegovy for 1 week.      Venetia Night, MSN, FNP-BC, AGACNP-BC [facility]

## 2022-04-22 ENCOUNTER — Encounter: Payer: Self-pay | Admitting: Gastroenterology

## 2022-04-22 ENCOUNTER — Ambulatory Visit (INDEPENDENT_AMBULATORY_CARE_PROVIDER_SITE_OTHER): Payer: BC Managed Care – PPO | Admitting: Gastroenterology

## 2022-04-22 VITALS — BP 132/83 | HR 75 | Temp 97.5°F

## 2022-04-22 DIAGNOSIS — Z1211 Encounter for screening for malignant neoplasm of colon: Secondary | ICD-10-CM | POA: Diagnosis not present

## 2022-04-22 DIAGNOSIS — K5900 Constipation, unspecified: Secondary | ICD-10-CM | POA: Diagnosis not present

## 2022-04-22 DIAGNOSIS — R140 Abdominal distension (gaseous): Secondary | ICD-10-CM

## 2022-04-22 NOTE — Progress Notes (Deleted)
Has had some constipation in the past but has a lot of bloating. Felt like she had a brick sitting in her abdomen. Bloating does not occur all the time but is more than normal and is very uncomfortable. She admits to not going to the doctor regularly. She states she went to urgent care and had a x ray and they asked her about the last time she had a BM. At times when she has a really good bowel movement and she will feel better. Has that full feeling and not really abdominal pain. Does not feel hungry at times because she feels full. She eats because she needs to not because she needs to. Sometimes stools are hard and then sometimes it is runny. Since her gallbladder was removed she is used to the runnier stools. Tried miralax for 2 days. Does not feel as though she is drinking   Has been switching work schedules.   Has a BM about twice per week. No rectal pain. Has had some hemorrhoids and has some rare toilet tissue hematochezia. Decided not to take [facility] (decided against it).   Has indigestion and takes an otc. Takes Advil and tylenol as needed. Does take diclofenac as needed for foot pain/inflammation due to a heel spur.   Reports recent CT and Ultrasound. Has never had a colonoscopy. No FH of colon cancer. Unsure about colon polyps - potentially her father.    Proceed with colonoscopy with propofol by Dr. Abbey Chatters  in near future: the risks, benefits, and alternatives have been discussed with the patient in detail. The patient states understanding and desires to proceed.  ASA 2  Miralax 17 g once daily Low FODMAP Anti gas medicaiton as needed Continue otc prevacid.

## 2022-04-22 NOTE — Patient Instructions (Signed)
For your constipation: Miralax 17 g once daily in 8 ox fluid of your choice.  Increase water intake (goal at least 4-6 bottles of water daily) Start daily fiber supplement such as Benefiber 2-3 teaspoons daily.  Low FODMAP - handout attached You may use Gas-X, Beano, or Phazyme as needed for increased bloating and gas discomfort.  Slowly implement these recommendations into your daily life.  Continue over-the-counter Prevacid. Follow a GERD diet:  Avoid fried, fatty, greasy, spicy, citrus foods. Avoid caffeine and carbonated beverages. Avoid chocolate. Try eating 4-6 small meals a day rather than 3 large meals. Do not eat within 3 hours of laying down. Prop head of bed up on wood or bricks to create a 6 inch incline.  We will get you scheduled for colonoscopy in the near future with Dr. Abbey Chatters.  You will receive more information regarding preop appointment as well as when to be at the hospital by our scheduler in the preop nurse at [facility].  We will follow-up about 6 to 8 weeks after your colonoscopy.  It was a pleasure to see you today. I want to create trusting relationships with patients. If you receive a survey regarding your visit,  I greatly appreciate you taking time to fill this out on paper or through your MyChart. I value your feedback.  Venetia Night, MSN, FNP-BC, AGACNP-BC [facility]

## 2022-04-24 ENCOUNTER — Encounter: Payer: Self-pay | Admitting: *Deleted

## 2022-05-07 DIAGNOSIS — M1712 Unilateral primary osteoarthritis, left knee: Secondary | ICD-10-CM | POA: Diagnosis not present

## 2022-09-03 ENCOUNTER — Other Ambulatory Visit (HOSPITAL_BASED_OUTPATIENT_CLINIC_OR_DEPARTMENT_OTHER): Payer: Self-pay

## 2022-09-03 MED ORDER — WEGOVY 0.5 MG/0.5ML ~~LOC~~ SOAJ
0.5000 mg | SUBCUTANEOUS | 0 refills | Status: AC
  Filled 2022-09-03: qty 2, 28d supply, fill #0

## 2022-09-06 ENCOUNTER — Other Ambulatory Visit (HOSPITAL_BASED_OUTPATIENT_CLINIC_OR_DEPARTMENT_OTHER): Payer: Self-pay

## 2022-09-24 ENCOUNTER — Other Ambulatory Visit (HOSPITAL_BASED_OUTPATIENT_CLINIC_OR_DEPARTMENT_OTHER): Payer: Self-pay

## 2022-09-24 MED ORDER — WEGOVY 1 MG/0.5ML ~~LOC~~ SOAJ
1.0000 mg | SUBCUTANEOUS | 0 refills | Status: AC
  Filled 2022-09-24: qty 2, 28d supply, fill #0

## 2022-11-06 IMAGING — DX DG CHEST 1V PORT
1 series · 1 of 1 positions shown · non-contrast
Comparison: None.

CLINICAL DATA: Chest pain

EXAM:
PORTABLE CHEST 1 VIEW

[chest ap]
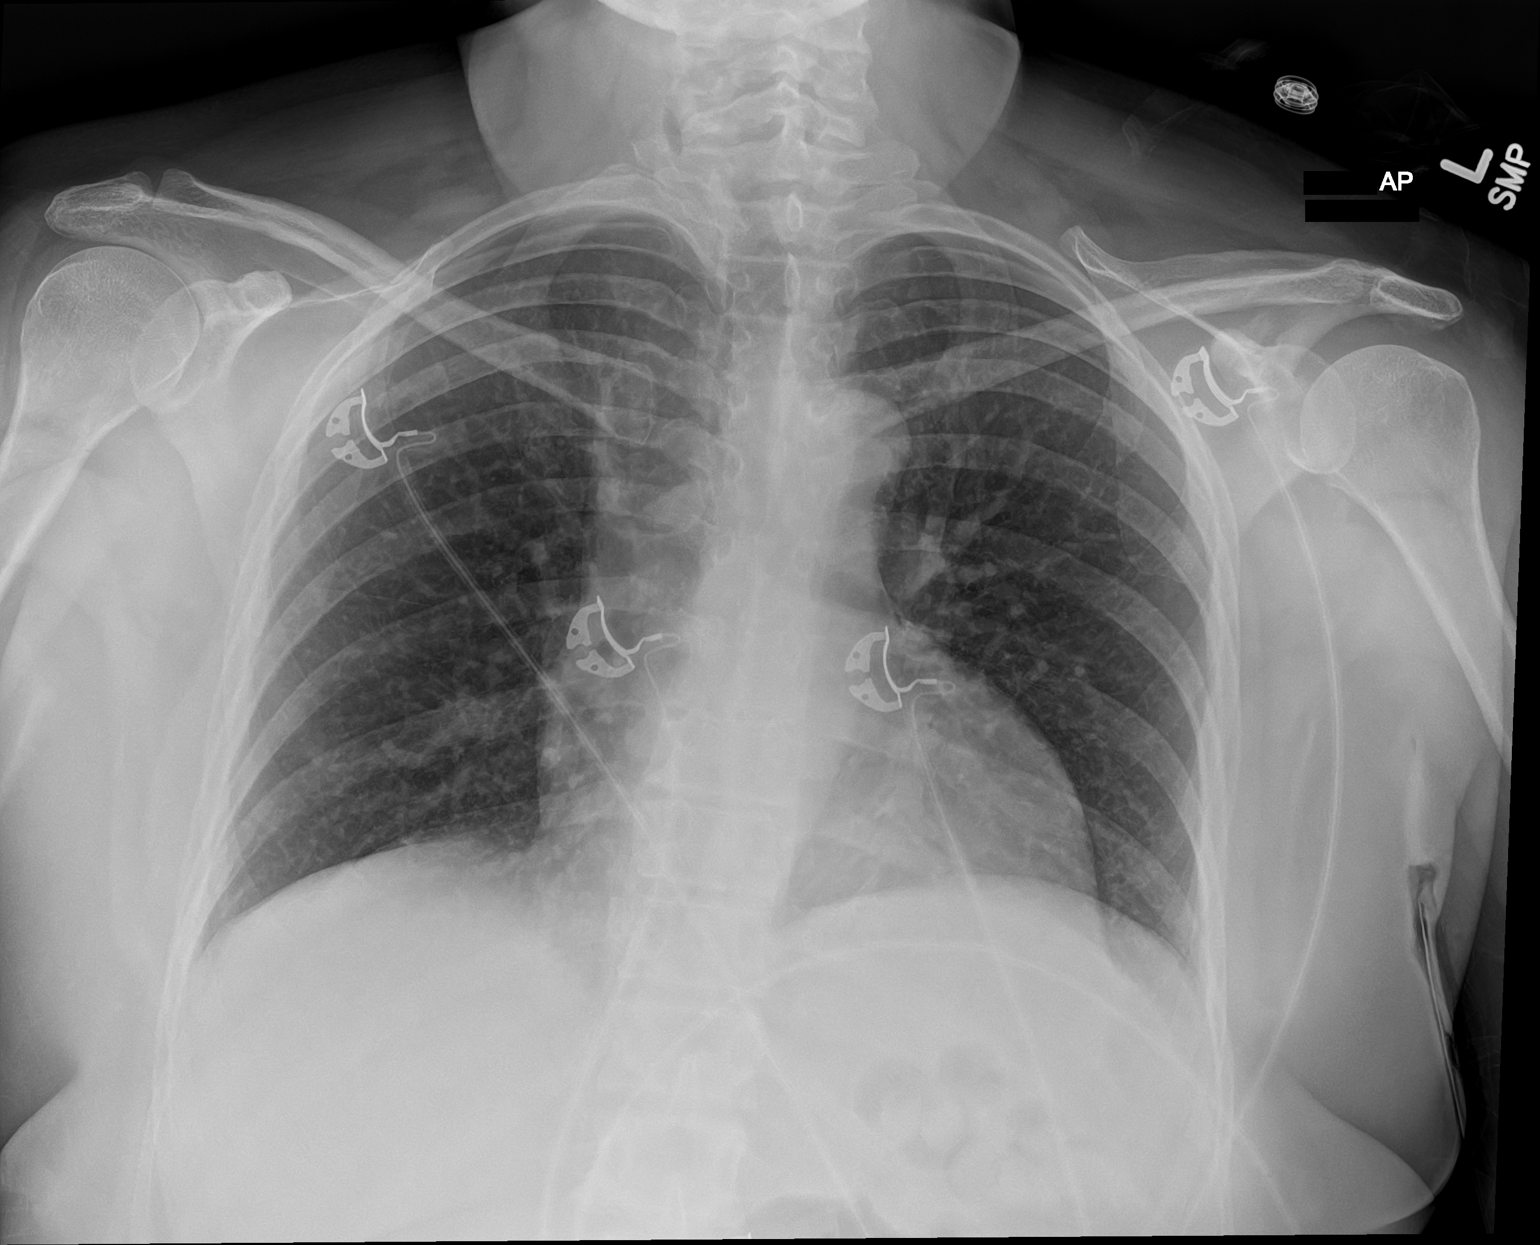

[1 of 1 positions shown; findings below may reference images not displayed]

FINDINGS: Normal mediastinum and cardiac silhouette. Normal pulmonary
vasculature. No evidence of effusion, infiltrate, or pneumothorax.
No acute bony abnormality.
IMPRESSION: No acute cardiopulmonary process.

## 2023-01-10 ENCOUNTER — Other Ambulatory Visit (HOSPITAL_BASED_OUTPATIENT_CLINIC_OR_DEPARTMENT_OTHER): Payer: Self-pay

## 2023-01-10 MED ORDER — WEGOVY 1.7 MG/0.75ML ~~LOC~~ SOAJ
1.7000 mg | SUBCUTANEOUS | 1 refills | Status: AC
  Filled 2023-01-10 – 2023-01-14 (×3): qty 3, 28d supply, fill #0
  Filled 2023-08-04: qty 3, 28d supply, fill #1

## 2023-01-14 ENCOUNTER — Other Ambulatory Visit (HOSPITAL_COMMUNITY): Payer: Self-pay

## 2023-01-14 ENCOUNTER — Other Ambulatory Visit (HOSPITAL_BASED_OUTPATIENT_CLINIC_OR_DEPARTMENT_OTHER): Payer: Self-pay

## 2023-01-14 ENCOUNTER — Other Ambulatory Visit: Payer: Self-pay

## 2023-02-06 ENCOUNTER — Other Ambulatory Visit (HOSPITAL_BASED_OUTPATIENT_CLINIC_OR_DEPARTMENT_OTHER): Payer: Self-pay

## 2023-02-06 MED ORDER — WEGOVY 1.7 MG/0.75ML ~~LOC~~ SOAJ
1.7000 mg | SUBCUTANEOUS | 1 refills | Status: AC
  Filled 2023-02-06 – 2023-02-10 (×2): qty 3, 28d supply, fill #0
  Filled 2023-11-18: qty 3, 28d supply, fill #1

## 2023-02-10 ENCOUNTER — Other Ambulatory Visit (HOSPITAL_COMMUNITY): Payer: Self-pay

## 2023-02-10 ENCOUNTER — Other Ambulatory Visit (HOSPITAL_BASED_OUTPATIENT_CLINIC_OR_DEPARTMENT_OTHER): Payer: Self-pay

## 2023-02-11 ENCOUNTER — Other Ambulatory Visit: Payer: Self-pay

## 2023-02-13 ENCOUNTER — Other Ambulatory Visit (HOSPITAL_BASED_OUTPATIENT_CLINIC_OR_DEPARTMENT_OTHER): Payer: Self-pay

## 2023-02-13 ENCOUNTER — Other Ambulatory Visit (HOSPITAL_COMMUNITY): Payer: Self-pay

## 2023-02-14 ENCOUNTER — Other Ambulatory Visit: Payer: Self-pay

## 2023-03-12 ENCOUNTER — Other Ambulatory Visit (HOSPITAL_BASED_OUTPATIENT_CLINIC_OR_DEPARTMENT_OTHER): Payer: Self-pay

## 2023-03-12 MED ORDER — WEGOVY 1.7 MG/0.75ML ~~LOC~~ SOAJ
1.7000 mg | SUBCUTANEOUS | 1 refills | Status: AC
  Filled 2023-03-12 – 2023-03-25 (×2): qty 3, 28d supply, fill #0

## 2023-03-25 ENCOUNTER — Other Ambulatory Visit: Payer: Self-pay

## 2023-04-22 ENCOUNTER — Other Ambulatory Visit (HOSPITAL_BASED_OUTPATIENT_CLINIC_OR_DEPARTMENT_OTHER): Payer: Self-pay

## 2023-04-22 MED ORDER — WEGOVY 1.7 MG/0.75ML ~~LOC~~ SOAJ
1.7000 mg | SUBCUTANEOUS | 2 refills | Status: AC
  Filled 2023-04-22 – 2023-04-26 (×2): qty 3, 28d supply, fill #0

## 2023-04-26 ENCOUNTER — Other Ambulatory Visit (HOSPITAL_BASED_OUTPATIENT_CLINIC_OR_DEPARTMENT_OTHER): Payer: Self-pay

## 2023-04-26 ENCOUNTER — Other Ambulatory Visit (HOSPITAL_COMMUNITY): Payer: Self-pay

## 2023-05-29 DIAGNOSIS — Z6831 Body mass index (BMI) 31.0-31.9, adult: Secondary | ICD-10-CM | POA: Diagnosis not present

## 2023-05-29 DIAGNOSIS — E6609 Other obesity due to excess calories: Secondary | ICD-10-CM | POA: Diagnosis not present

## 2023-05-29 DIAGNOSIS — B0229 Other postherpetic nervous system involvement: Secondary | ICD-10-CM | POA: Diagnosis not present

## 2023-06-17 ENCOUNTER — Other Ambulatory Visit (HOSPITAL_BASED_OUTPATIENT_CLINIC_OR_DEPARTMENT_OTHER): Payer: Self-pay

## 2023-06-17 ENCOUNTER — Other Ambulatory Visit: Payer: Self-pay

## 2023-06-17 MED ORDER — WEGOVY 1.7 MG/0.75ML ~~LOC~~ SOAJ
1.7000 mg | SUBCUTANEOUS | 2 refills | Status: AC
  Filled 2023-06-17 – 2023-06-21 (×2): qty 3, 28d supply, fill #0
  Filled 2023-09-07: qty 3, 28d supply, fill #1
  Filled 2023-12-09: qty 3, 28d supply, fill #2

## 2023-06-20 ENCOUNTER — Other Ambulatory Visit (HOSPITAL_COMMUNITY): Payer: Self-pay | Admitting: Family Medicine

## 2023-06-20 DIAGNOSIS — Z01419 Encounter for gynecological examination (general) (routine) without abnormal findings: Secondary | ICD-10-CM | POA: Diagnosis not present

## 2023-06-20 DIAGNOSIS — E6609 Other obesity due to excess calories: Secondary | ICD-10-CM | POA: Diagnosis not present

## 2023-06-20 DIAGNOSIS — R1900 Intra-abdominal and pelvic swelling, mass and lump, unspecified site: Secondary | ICD-10-CM

## 2023-06-20 DIAGNOSIS — R310 Gross hematuria: Secondary | ICD-10-CM | POA: Diagnosis not present

## 2023-06-20 DIAGNOSIS — Z6830 Body mass index (BMI) 30.0-30.9, adult: Secondary | ICD-10-CM | POA: Diagnosis not present

## 2023-06-21 ENCOUNTER — Other Ambulatory Visit (HOSPITAL_BASED_OUTPATIENT_CLINIC_OR_DEPARTMENT_OTHER): Payer: Self-pay

## 2023-06-21 ENCOUNTER — Other Ambulatory Visit (HOSPITAL_COMMUNITY): Payer: Self-pay

## 2023-07-15 ENCOUNTER — Ambulatory Visit (HOSPITAL_COMMUNITY)
Admission: RE | Admit: 2023-07-15 | Discharge: 2023-07-15 | Disposition: A | Discharge status: Home / Self Care | Source: Ambulatory Visit | Attending: Family Medicine | Admitting: Family Medicine

## 2023-07-15 DIAGNOSIS — N83292 Other ovarian cyst, left side: Secondary | ICD-10-CM | POA: Diagnosis not present

## 2023-07-15 DIAGNOSIS — D259 Leiomyoma of uterus, unspecified: Secondary | ICD-10-CM | POA: Diagnosis not present

## 2023-07-15 DIAGNOSIS — N939 Abnormal uterine and vaginal bleeding, unspecified: Secondary | ICD-10-CM | POA: Diagnosis not present

## 2023-07-15 DIAGNOSIS — R310 Gross hematuria: Secondary | ICD-10-CM | POA: Insufficient documentation

## 2023-07-15 DIAGNOSIS — R1900 Intra-abdominal and pelvic swelling, mass and lump, unspecified site: Secondary | ICD-10-CM | POA: Insufficient documentation

## 2023-07-15 DIAGNOSIS — N83291 Other ovarian cyst, right side: Secondary | ICD-10-CM | POA: Diagnosis not present

## 2023-08-05 ENCOUNTER — Other Ambulatory Visit: Payer: Self-pay

## 2023-09-08 ENCOUNTER — Other Ambulatory Visit (HOSPITAL_COMMUNITY): Payer: Self-pay

## 2023-09-15 ENCOUNTER — Other Ambulatory Visit (HOSPITAL_BASED_OUTPATIENT_CLINIC_OR_DEPARTMENT_OTHER): Payer: Self-pay

## 2023-09-15 MED ORDER — WEGOVY 1.7 MG/0.75ML ~~LOC~~ SOAJ
1.7000 mg | SUBCUTANEOUS | 2 refills | Status: AC
  Filled 2023-10-11: qty 3, 28d supply, fill #0

## 2023-10-11 ENCOUNTER — Other Ambulatory Visit (HOSPITAL_COMMUNITY): Payer: Self-pay

## 2023-10-13 ENCOUNTER — Other Ambulatory Visit (HOSPITAL_COMMUNITY): Payer: Self-pay

## 2023-11-18 ENCOUNTER — Other Ambulatory Visit (HOSPITAL_COMMUNITY): Payer: Self-pay

## 2023-12-09 ENCOUNTER — Other Ambulatory Visit (HOSPITAL_COMMUNITY): Payer: Self-pay
